# Patient Record
Sex: Male | Born: 1950 | Race: Black or African American | Hispanic: No | Marital: Married | Smoking: Former smoker
Health system: Southern US, Community
[De-identification: ages and names within clinical notes are randomized; demographics above are authoritative.]

## PROBLEM LIST (undated history)

## (undated) DIAGNOSIS — I10 Essential (primary) hypertension: Secondary | ICD-10-CM

## (undated) DIAGNOSIS — E785 Hyperlipidemia, unspecified: Secondary | ICD-10-CM

## (undated) DIAGNOSIS — J45909 Unspecified asthma, uncomplicated: Secondary | ICD-10-CM

## (undated) DIAGNOSIS — K469 Unspecified abdominal hernia without obstruction or gangrene: Secondary | ICD-10-CM

## (undated) DIAGNOSIS — E119 Type 2 diabetes mellitus without complications: Secondary | ICD-10-CM

## (undated) DIAGNOSIS — N400 Enlarged prostate without lower urinary tract symptoms: Secondary | ICD-10-CM

## (undated) HISTORY — DX: Hyperlipidemia, unspecified: E78.5

## (undated) HISTORY — DX: Unspecified abdominal hernia without obstruction or gangrene: K46.9

## (undated) HISTORY — DX: Type 2 diabetes mellitus without complications: E11.9

## (undated) HISTORY — PX: EYE SURGERY: SHX253

## (undated) HISTORY — DX: Essential (primary) hypertension: I10

## (undated) HISTORY — PX: CIRCUMCISION: SHX1350

---

## 2010-12-25 DIAGNOSIS — E785 Hyperlipidemia, unspecified: Secondary | ICD-10-CM | POA: Insufficient documentation

## 2011-11-26 DIAGNOSIS — E119 Type 2 diabetes mellitus without complications: Secondary | ICD-10-CM | POA: Insufficient documentation

## 2012-07-25 DIAGNOSIS — H269 Unspecified cataract: Secondary | ICD-10-CM | POA: Insufficient documentation

## 2012-07-25 DIAGNOSIS — G4730 Sleep apnea, unspecified: Secondary | ICD-10-CM | POA: Insufficient documentation

## 2012-07-25 DIAGNOSIS — I10 Essential (primary) hypertension: Secondary | ICD-10-CM | POA: Insufficient documentation

## 2013-12-31 DIAGNOSIS — Z550 Illiteracy and low-level literacy: Secondary | ICD-10-CM | POA: Insufficient documentation

## 2015-07-01 DIAGNOSIS — R972 Elevated prostate specific antigen [PSA]: Secondary | ICD-10-CM | POA: Insufficient documentation

## 2016-11-08 DIAGNOSIS — N401 Enlarged prostate with lower urinary tract symptoms: Secondary | ICD-10-CM | POA: Insufficient documentation

## 2016-11-08 DIAGNOSIS — R3912 Poor urinary stream: Secondary | ICD-10-CM

## 2017-01-03 DIAGNOSIS — Z720 Tobacco use: Secondary | ICD-10-CM | POA: Insufficient documentation

## 2017-09-30 ENCOUNTER — Encounter: Payer: Self-pay | Admitting: *Deleted

## 2017-10-08 ENCOUNTER — Other Ambulatory Visit: Payer: Self-pay

## 2017-10-08 DIAGNOSIS — Z1211 Encounter for screening for malignant neoplasm of colon: Secondary | ICD-10-CM

## 2017-10-22 ENCOUNTER — Telehealth: Payer: Self-pay | Admitting: Gastroenterology

## 2017-10-22 NOTE — Telephone Encounter (Signed)
Pt wife would like to r/s procedure she states they do not have the  Money to pay up front please call to r/s

## 2017-10-22 NOTE — Telephone Encounter (Signed)
Patients wife contacted office to reschedule colonoscopy due to finances.  Offered to change rx to alternative she said they still would not have finances until check day.  Colonoscopy has been rescheduled to 11/12/17 with Dr. Vicente Males.  Endoscopy has been informed.  Thanks [employer]

## 2017-10-29 ENCOUNTER — Ambulatory Visit: Admit: 2017-10-29 | Payer: Self-pay | Admitting: Gastroenterology

## 2017-10-29 SURGERY — COLONOSCOPY WITH PROPOFOL
Anesthesia: General

## 2017-12-25 ENCOUNTER — Other Ambulatory Visit: Payer: Self-pay

## 2017-12-25 ENCOUNTER — Ambulatory Visit (INDEPENDENT_AMBULATORY_CARE_PROVIDER_SITE_OTHER): Payer: Medicare Other | Admitting: Gastroenterology

## 2017-12-25 ENCOUNTER — Encounter: Payer: Self-pay | Admitting: Gastroenterology

## 2017-12-25 VITALS — BP 126/75 | HR 66 | Resp 16 | Wt 156.4 lb

## 2017-12-25 DIAGNOSIS — K4090 Unilateral inguinal hernia, without obstruction or gangrene, not specified as recurrent: Secondary | ICD-10-CM | POA: Diagnosis not present

## 2017-12-25 DIAGNOSIS — R1031 Right lower quadrant pain: Secondary | ICD-10-CM

## 2017-12-25 DIAGNOSIS — N5089 Other specified disorders of the male genital organs: Secondary | ICD-10-CM

## 2017-12-25 DIAGNOSIS — G8929 Other chronic pain: Secondary | ICD-10-CM | POA: Diagnosis not present

## 2017-12-25 NOTE — Progress Notes (Signed)
Cephas Darby, MD [address]  [place]  [place], [place] [address]  Main: [phone]  Fax: [phone]    Gastroenterology Consultation  Referring Provider:     [facility] [place] Physician:  Linus Salmons, PA-C Primary Gastroenterologist:  Dr. Cephas Darby Reason for Consultation:     Right inguinal swelling and pain        HPI:   Malik Bridges is a 67 y.o. male referred by Dr. Cleaster Corin, Lucky Rathke, PA-C  for consultation & management of right inguinal swelling and pain and to discuss about colonoscopy.  Patient reports that he has noticed swelling in his right inguinal area about 5 years ago, progressively getting worse.  Lately, it is causing discomfort.  He also notices swelling in his scrotal area as well. He was seen by urology in 2018 at [facility] for elevated PSA who recommended prostate biopsy.  From chart review, it appears that patient did not follow-up.  He denies rectal bleeding, constipation or diarrhea.  He denies weight loss.  He reports having had a colonoscopy several years ago at [facility].  He does not recall the results from the procedure.  NSAIDs: None  Antiplts/Anticoagulants/Anti thrombotics: None  GI Procedures: Colonoscopy at [facility] several years ago, report not available He denies any GI surgeries He denies family history of GI malignancy in first-degree relatives  History reviewed. No pertinent past medical history.  History reviewed. No pertinent surgical history.  Current Outpatient Medications:  .  albuterol (PROVENTIL HFA;VENTOLIN HFA) 108 (90 Base) MCG/ACT inhaler, Inhale into the lungs., Disp: , Rfl:  .  aspirin EC 81 MG tablet, Take by mouth., Disp: , Rfl:  .  atorvastatin (LIPITOR) 20 MG tablet, Take by mouth., Disp: , Rfl:  .  glucose blood (PRECISION QID TEST) test strip, Check twice a day, E11.42, Disp: , Rfl:  .  lisinopril (PRINIVIL,ZESTRIL) 20 MG tablet, Take by mouth., Disp: , Rfl:  .  metFORMIN (GLUCOPHAGE)  1000 MG tablet, Take by mouth., Disp: , Rfl:  .  Misc. Devices (DELUXE TABLET CUTTER) MISC, Use to cut metformin tablets., Disp: , Rfl:  .  spironolactone (ALDACTONE) 25 MG tablet, Take by mouth., Disp: , Rfl:  .  tamsulosin (FLOMAX) 0.4 MG CAPS capsule, Take by mouth., Disp: , Rfl:  .  nicotine polacrilex (NICORETTE) 2 MG gum, Take by mouth., Disp: , Rfl:     History reviewed. No pertinent family history.   Social History   Tobacco Use  . Smoking status: Current Every Day Smoker    Types: Cigarettes  . Smokeless tobacco: Never Used  Substance Use Topics  . Alcohol use: Not Currently  . Drug use: Never    Allergies as of 12/25/2017 - Review Complete 12/25/2017  Allergen Reaction Noted  . Hydrochlorothiazide Other (See Comments) 03/16/2014    Review of Systems:    All systems reviewed and negative except where noted in HPI.   Physical Exam:  BP 126/75 (BP Location: Left Arm, Patient Position: Sitting, Cuff Size: Normal)   Pulse 66   Resp 16   Wt 156 lb 6.4 oz (70.9 kg)  No LMP for male patient.  General:   Alert,  Well-developed, well-nourished, pleasant and cooperative in NAD Head:  Normocephalic and atraumatic. Eyes:  Sclera clear, no icterus.   Conjunctiva pink. Ears:  Normal auditory acuity. Nose:  No deformity, discharge, or lesions. Mouth:  No deformity or lesions,oropharynx pink & moist. Neck:  Supple; no masses  or thyromegaly. Lungs:  Respirations even and unlabored.  Clear throughout to auscultation.   No wheezes, crackles, or rhonchi. No acute distress. Heart:  Regular rate and rhythm; no murmurs, clicks, rubs, or gallops. Abdomen:  Normal bowel sounds. Soft, non-tender, obese and non-distended, lemon sized localized swelling in the right inguinal area, mildly tender.  No guarding or rebound tenderness.   Rectal: Not performed Msk:  Symmetrical without gross deformities. Good, equal movement & strength bilaterally. Pulses:  Normal pulses noted. Extremities:   No clubbing or edema.  No cyanosis. Neurologic:  Alert and oriented x3;  grossly normal neurologically. Skin:  Intact without significant lesions or rashes. No jaundice. Lymph Nodes:  No significant cervical adenopathy. Psych:  Alert and cooperative. Normal mood and affect.  Imaging Studies: None  Assessment and Plan:   Malik Bridges is a 67 y.o. African-American male with obesity, hypertension, hyperlipidemia, diabetes who presents with chronic right inguinal swelling and discomfort.  Right inguinal swelling that discomfort Recommend CT abdomen and pelvis with contrast Ultrasound of the scrotum Check CBC, CMP  Colon cancer screening Discuss about colonoscopy after the above work-up.  If CT confirms presence of inguinal hernia, I will refer to surgery first prior to performing colonoscopy.   Follow up in 1 month   Cephas Darby, MD

## 2017-12-26 LAB — COMPREHENSIVE METABOLIC PANEL
ALBUMIN: 4.5 g/dL (ref 3.6–4.8)
ALK PHOS: 75 IU/L (ref 39–117)
ALT: 16 IU/L (ref 0–44)
AST: 23 IU/L (ref 0–40)
Albumin/Globulin Ratio: 1.6 (ref 1.2–2.2)
BUN / CREAT RATIO: 8 — AB (ref 10–24)
BUN: 7 mg/dL — ABNORMAL LOW (ref 8–27)
Bilirubin Total: 0.6 mg/dL (ref 0.0–1.2)
CO2: 26 mmol/L (ref 20–29)
CREATININE: 0.9 mg/dL (ref 0.76–1.27)
Calcium: 9.3 mg/dL (ref 8.6–10.2)
Chloride: 100 mmol/L (ref 96–106)
GFR, EST AFRICAN AMERICAN: 102 mL/min/{1.73_m2} (ref >59)
GFR, EST NON AFRICAN AMERICAN: 88 mL/min/{1.73_m2} (ref >59)
GLOBULIN, TOTAL: 2.9 g/dL (ref 1.5–4.5)
Glucose: 138 mg/dL — ABNORMAL HIGH (ref 65–99)
Potassium: 4.1 mmol/L (ref 3.5–5.2)
SODIUM: 141 mmol/L (ref 134–144)
TOTAL PROTEIN: 7.4 g/dL (ref 6.0–8.5)

## 2017-12-26 LAB — CBC
HEMATOCRIT: 48.4 % (ref 37.5–51.0)
HEMOGLOBIN: 15.7 g/dL (ref 13.0–17.7)
MCH: 27.2 pg (ref 26.6–33.0)
MCHC: 32.4 g/dL (ref 31.5–35.7)
MCV: 84 fL (ref 79–97)
Platelets: 222 10*3/uL (ref 150–450)
RBC: 5.78 x10E6/uL (ref 4.14–5.80)
RDW: 12.3 % (ref 12.3–15.4)
WBC: 3.2 10*3/uL — ABNORMAL LOW (ref 3.4–10.8)

## 2017-12-30 ENCOUNTER — Emergency Department
Admission: EM | Admit: 2017-12-30 | Discharge: 2017-12-30 | Disposition: A | Discharge status: Home / Self Care | Payer: Medicare Other | Attending: Emergency Medicine | Admitting: Emergency Medicine

## 2017-12-30 ENCOUNTER — Other Ambulatory Visit: Payer: Self-pay

## 2017-12-30 DIAGNOSIS — R109 Unspecified abdominal pain: Secondary | ICD-10-CM | POA: Insufficient documentation

## 2017-12-30 DIAGNOSIS — K5900 Constipation, unspecified: Secondary | ICD-10-CM | POA: Insufficient documentation

## 2017-12-30 DIAGNOSIS — Z5321 Procedure and treatment not carried out due to patient leaving prior to being seen by health care provider: Secondary | ICD-10-CM | POA: Diagnosis not present

## 2017-12-30 LAB — CBC
HEMATOCRIT: 45.2 % (ref 39.0–52.0)
HEMOGLOBIN: 14.9 g/dL (ref 13.0–17.0)
MCH: 27.5 pg (ref 26.0–34.0)
MCHC: 33 g/dL (ref 30.0–36.0)
MCV: 83.5 fL (ref 80.0–100.0)
Platelets: 199 10*3/uL (ref 150–400)
RBC: 5.41 MIL/uL (ref 4.22–5.81)
RDW: 12.1 % (ref 11.5–15.5)
WBC: 4.1 10*3/uL (ref 4.0–10.5)
nRBC: 0 % (ref 0.0–0.2)

## 2017-12-30 LAB — COMPREHENSIVE METABOLIC PANEL
ALBUMIN: 4.1 g/dL (ref 3.5–5.0)
ALT: 13 U/L (ref 0–44)
AST: 21 U/L (ref 15–41)
Alkaline Phosphatase: 62 U/L (ref 38–126)
Anion gap: 8 (ref 5–15)
BUN: 9 mg/dL (ref 8–23)
CHLORIDE: 102 mmol/L (ref 98–111)
CO2: 28 mmol/L (ref 22–32)
CREATININE: 0.84 mg/dL (ref 0.61–1.24)
Calcium: 8.7 mg/dL — ABNORMAL LOW (ref 8.9–10.3)
GFR calc Af Amer: >60 mL/min (ref >60)
GFR calc non Af Amer: >60 mL/min (ref >60)
GLUCOSE: 135 mg/dL — AB (ref 70–99)
POTASSIUM: 3.3 mmol/L — AB (ref 3.5–5.1)
Sodium: 138 mmol/L (ref 135–145)
Total Bilirubin: 1.1 mg/dL (ref 0.3–1.2)
Total Protein: 7.3 g/dL (ref 6.5–8.1)

## 2017-12-30 LAB — LIPASE, BLOOD: LIPASE: 44 U/L (ref 11–51)

## 2017-12-30 NOTE — ED Triage Notes (Signed)
Pt c/o constipation and abdominal pain x5 days.

## 2017-12-30 NOTE — ED Notes (Signed)
Malik Bridges registration says patient left at [phone]

## 2018-01-01 ENCOUNTER — Telehealth: Payer: Self-pay | Admitting: Gastroenterology

## 2018-01-01 ENCOUNTER — Ambulatory Visit: Payer: Medicare Other

## 2018-01-01 NOTE — Telephone Encounter (Signed)
Malik Bridges with CT scan is calling she has been trying to reach pt but is unable to leave a message for pt to call her to inform about CT prep cb [phone]

## 2018-01-02 ENCOUNTER — Ambulatory Visit: Admission: RE | Admit: 2018-01-02 | Payer: Medicare Other | Source: Ambulatory Visit

## 2018-01-02 ENCOUNTER — Ambulatory Visit: Payer: Medicare Other

## 2018-01-09 ENCOUNTER — Ambulatory Visit
Admission: RE | Admit: 2018-01-09 | Discharge: 2018-01-09 | Disposition: A | Discharge status: Home / Self Care | Payer: Medicare Other | Source: Ambulatory Visit | Attending: Gastroenterology | Admitting: Gastroenterology

## 2018-01-09 DIAGNOSIS — N400 Enlarged prostate without lower urinary tract symptoms: Secondary | ICD-10-CM | POA: Insufficient documentation

## 2018-01-09 DIAGNOSIS — I700 Atherosclerosis of aorta: Secondary | ICD-10-CM | POA: Insufficient documentation

## 2018-01-09 DIAGNOSIS — N5089 Other specified disorders of the male genital organs: Secondary | ICD-10-CM | POA: Insufficient documentation

## 2018-01-09 DIAGNOSIS — K4020 Bilateral inguinal hernia, without obstruction or gangrene, not specified as recurrent: Secondary | ICD-10-CM | POA: Diagnosis not present

## 2018-01-09 DIAGNOSIS — N503 Cyst of epididymis: Secondary | ICD-10-CM | POA: Insufficient documentation

## 2018-01-09 DIAGNOSIS — N433 Hydrocele, unspecified: Secondary | ICD-10-CM | POA: Insufficient documentation

## 2018-01-09 DIAGNOSIS — R1031 Right lower quadrant pain: Secondary | ICD-10-CM | POA: Diagnosis not present

## 2018-01-09 DIAGNOSIS — K869 Disease of pancreas, unspecified: Secondary | ICD-10-CM | POA: Diagnosis not present

## 2018-01-09 DIAGNOSIS — G8929 Other chronic pain: Secondary | ICD-10-CM | POA: Diagnosis not present

## 2018-01-09 MED ORDER — IOPAMIDOL (ISOVUE-300) INJECTION 61%
100.0000 mL | Freq: Once | INTRAVENOUS | Status: AC | PRN
  Administered 2018-01-09: 100 mL via INTRAVENOUS

## 2018-02-06 ENCOUNTER — Ambulatory Visit: Payer: Medicare Other | Admitting: Gastroenterology

## 2018-03-20 ENCOUNTER — Telehealth: Payer: Self-pay | Admitting: Gastroenterology

## 2018-03-20 ENCOUNTER — Encounter: Payer: Self-pay | Admitting: Gastroenterology

## 2018-03-20 ENCOUNTER — Ambulatory Visit (INDEPENDENT_AMBULATORY_CARE_PROVIDER_SITE_OTHER): Payer: Medicare Other | Admitting: Gastroenterology

## 2018-03-20 VITALS — BP 156/80 | HR 76 | Resp 17 | Wt 160.0 lb

## 2018-03-20 DIAGNOSIS — N433 Hydrocele, unspecified: Secondary | ICD-10-CM | POA: Diagnosis not present

## 2018-03-20 DIAGNOSIS — K4020 Bilateral inguinal hernia, without obstruction or gangrene, not specified as recurrent: Secondary | ICD-10-CM

## 2018-03-20 DIAGNOSIS — K862 Cyst of pancreas: Secondary | ICD-10-CM | POA: Diagnosis not present

## 2018-03-20 DIAGNOSIS — Z1211 Encounter for screening for malignant neoplasm of colon: Secondary | ICD-10-CM | POA: Diagnosis not present

## 2018-03-20 NOTE — Telephone Encounter (Signed)
Malik Bridges's called & would like to know when & where he needs to get his bowel prep for his colonoscopy. Please advise

## 2018-03-20 NOTE — Progress Notes (Signed)
Cephas Darby, MD [address]  [place]  [place], [place] [address]  Main: [phone]  Fax: [phone]    Gastroenterology Consultation  Referring Provider:     Kerri Perches, PA-C Primary Care Physician:  Kerri Perches, PA-C Primary Gastroenterologist:  Dr. Cephas Darby Reason for Consultation:     Right inguinal swelling and pain        HPI:   Malik Bridges is a 68 y.o. male referred by Dr. Kerri Perches, PA-C  for consultation & management of right inguinal swelling and pain and to discuss about colonoscopy.  Patient reports that he has noticed swelling in his right inguinal area about 5 years ago, progressively getting worse.  Lately, it is causing discomfort.  He also notices swelling in his scrotal area as well. He was seen by urology in 2018 at [facility] for elevated PSA who recommended prostate biopsy.  From chart review, it appears that patient did not follow-up.  He denies rectal bleeding, constipation or diarrhea.  He denies weight loss.  He reports having had a colonoscopy several years ago at [facility].  He does not recall the results from the procedure.  Follow-up visit 03/20/2018 Patient underwent a CT abdomen which revealed fat-containing bilateral inguinal hernias and enlarged prostate.  He underwent ultrasound scrotum which revealed bilateral large hydroceles.  Patient is here to discuss about further recommendations.  He reports ongoing symptoms from hydrocele.  He did not undergo colonoscopy with me yet.  CT also revealed 1 cm cystic mass in head of the pancreas adjacent to the distal CBD  NSAIDs: None  Antiplts/Anticoagulants/Anti thrombotics: None  GI Procedures: Colonoscopy at [facility] several years ago, report not available He denies any GI surgeries He denies family history of GI malignancy in first-degree relatives  Past Medical History:  Diagnosis Date  . Abdominal hernia   . Diabetes mellitus ([place])    Pt taking Insulin  . Hyperlipidemia   .  Hypertension     No past surgical history on file.  Current Outpatient Medications:  .  albuterol (PROVENTIL HFA;VENTOLIN HFA) 108 (90 Base) MCG/ACT inhaler, Inhale into the lungs., Disp: , Rfl:  .  aspirin EC 81 MG tablet, Take by mouth., Disp: , Rfl:  .  atorvastatin (LIPITOR) 20 MG tablet, Take by mouth., Disp: , Rfl:  .  glucose blood (PRECISION QID TEST) test strip, Check twice a day, E11.42, Disp: , Rfl:  .  lisinopril (PRINIVIL,ZESTRIL) 20 MG tablet, Take by mouth., Disp: , Rfl:  .  metFORMIN (GLUCOPHAGE) 1000 MG tablet, Take by mouth., Disp: , Rfl:  .  Misc. Devices (DELUXE TABLET CUTTER) MISC, Use to cut metformin tablets., Disp: , Rfl:  .  nicotine polacrilex (NICORETTE) 2 MG gum, Take by mouth., Disp: , Rfl:  .  spironolactone (ALDACTONE) 25 MG tablet, Take by mouth., Disp: , Rfl:  .  tamsulosin (FLOMAX) 0.4 MG CAPS capsule, Take by mouth., Disp: , Rfl:     No family history on file.   Social History   Tobacco Use  . Smoking status: Current Every Day Smoker    Types: Cigarettes  . Smokeless tobacco: Never Used  Substance Use Topics  . Alcohol use: Not Currently  . Drug use: Never    Allergies as of 03/20/2018 - Review Complete 03/20/2018  Allergen Reaction Noted  . Hydrochlorothiazide Other (See Comments) 03/16/2014    Review of Systems:    All systems reviewed and negative except where noted in HPI.  Physical Exam:  BP (!) 156/80 (BP Location: Left Arm, Patient Position: Sitting, Cuff Size: Large)   Pulse 76   Resp 17   Wt 160 lb (72.6 kg)   BMI 29.26 kg/m  No LMP for male patient.  General:   Alert,  Well-developed, well-nourished, pleasant and cooperative in NAD Head:  Normocephalic and atraumatic. Eyes:  Sclera clear, no icterus.   Conjunctiva pink. Ears:  Normal auditory acuity. Nose:  No deformity, discharge, or lesions. Mouth:  No deformity or lesions,oropharynx pink & moist. Neck:  Supple; no masses or thyromegaly. Lungs:  Respirations even  and unlabored.  Clear throughout to auscultation.   No wheezes, crackles, or rhonchi. No acute distress. Heart:  Regular rate and rhythm; no murmurs, clicks, rubs, or gallops. Abdomen:  Normal bowel sounds. Soft, non-tender, obese and non-distended, lemon sized localized swelling in the right inguinal area, mildly tender.  No guarding or rebound tenderness.   Rectal: Not performed Msk:  Symmetrical without gross deformities. Good, equal movement & strength bilaterally. Pulses:  Normal pulses noted. Extremities:  No clubbing or edema.  No cyanosis. Neurologic:  Alert and oriented x3;  grossly normal neurologically. Skin:  Intact without significant lesions or rashes. No jaundice. Psych:  Alert and cooperative. Normal mood and affect.  Imaging Studies: Reviewed  Assessment and Plan:   Malik Bridges is a 68 y.o. African-American male with obesity, hypertension, hyperlipidemia, diabetes who presents for follow-up of bilateral inguinal hernia and bilateral hydroceles  Bilateral inguinal hernia and bilateral hydroceles Referral to general surgery for surgical evaluation  Colon cancer screening Recommend screening colonoscopy in next 2 to 3 weeks  I have discussed alternative options, risks & benefits,  which include, but are not limited to, bleeding, infection, perforation,respiratory complication & drug reaction.  The patient agrees with this plan & written consent will be obtained.    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

## 2018-03-21 NOTE — Telephone Encounter (Signed)
[facility] called again inquiring about patient's colonoscopy & prep. Please advise

## 2018-03-24 ENCOUNTER — Other Ambulatory Visit: Payer: Self-pay

## 2018-03-24 ENCOUNTER — Telehealth: Payer: Self-pay | Admitting: Gastroenterology

## 2018-03-24 DIAGNOSIS — Z1211 Encounter for screening for malignant neoplasm of colon: Secondary | ICD-10-CM

## 2018-03-24 NOTE — Telephone Encounter (Signed)
Colonoscopy has been scheduled for 04/10/2018, pt has been notified

## 2018-03-24 NOTE — Telephone Encounter (Signed)
Patient called & L/M on our V/M stating he was returning a call to our office.

## 2018-04-04 ENCOUNTER — Encounter: Payer: Self-pay | Admitting: *Deleted

## 2018-04-07 ENCOUNTER — Encounter: Admission: RE | Payer: Self-pay | Source: Home / Self Care

## 2018-04-07 ENCOUNTER — Telehealth: Payer: Self-pay | Admitting: Gastroenterology

## 2018-04-07 ENCOUNTER — Ambulatory Visit: Admission: RE | Admit: 2018-04-07 | Payer: Medicare Other | Source: Home / Self Care | Admitting: Gastroenterology

## 2018-04-07 DIAGNOSIS — Z1211 Encounter for screening for malignant neoplasm of colon: Secondary | ICD-10-CM

## 2018-04-07 SURGERY — COLONOSCOPY WITH PROPOFOL
Anesthesia: General

## 2018-04-07 NOTE — Telephone Encounter (Signed)
Nilda Simmer called to let us know Mr Malik Bridges was out of town & would not be at his Endoscopy apptment this morning @ 8:00am. She would like to reschedule,please call.

## 2018-04-08 NOTE — Telephone Encounter (Signed)
Malik Bridges's left vm regarding prev. message

## 2018-04-08 NOTE — Telephone Encounter (Signed)
I have made several attempts to contact pt and pt's VM is full unable to leave message.

## 2018-04-09 ENCOUNTER — Other Ambulatory Visit: Payer: Self-pay

## 2018-04-09 DIAGNOSIS — Z1211 Encounter for screening for malignant neoplasm of colon: Secondary | ICD-10-CM

## 2018-04-14 ENCOUNTER — Other Ambulatory Visit: Payer: Self-pay

## 2018-04-14 ENCOUNTER — Encounter: Payer: Self-pay | Admitting: *Deleted

## 2018-04-14 ENCOUNTER — Ambulatory Visit (INDEPENDENT_AMBULATORY_CARE_PROVIDER_SITE_OTHER): Payer: Medicare Other | Admitting: Surgery

## 2018-04-14 ENCOUNTER — Encounter: Payer: Self-pay | Admitting: Surgery

## 2018-04-14 ENCOUNTER — Telehealth: Payer: Self-pay | Admitting: *Deleted

## 2018-04-14 VITALS — BP 152/79 | HR 70 | Temp 97.7°F | Ht 62.0 in | Wt 162.0 lb

## 2018-04-14 DIAGNOSIS — K4020 Bilateral inguinal hernia, without obstruction or gangrene, not specified as recurrent: Secondary | ICD-10-CM | POA: Diagnosis not present

## 2018-04-14 NOTE — Progress Notes (Signed)
Patient's robotic bilateral inguinal hernia repair and umbilical hernia repair to be scheduled for 05-01-18 at [facility] with Dr. Dahlia Byes.  The patient is aware he will need to Pre-Admit. Patient will check in at the [place] [place], [address] (first floor). Patient has requested we contact his wife or daughter with Pre-admit appointment date and time.   The patient is aware to call the office should he have further questions.

## 2018-04-14 NOTE — H&P (View-Only) (Signed)
Patient ID: Malik Bridges, male   DOB: [birth year], 68 y.o.   MRN: [id number]  HPI Malik Bridges is a 68 y.o. male seen in consultation for symptomatic bilateral inguinal hernias.  Ports that he has inguinal pain for the last several months more pronounced on the right side.  It is intermittent moderate intensity and sharp in nature.  Worsening with Valsalva.  No evidence of strangulation or incarceration.  He has never had any previous abdominal operation or hernia repairs in the past.  He has had multiple images including a CT scan of the abdomen and pelvis that I have personally review showing evidence of bilateral inguinal hernia with large hydroceles bilateral.  I have also reviewed personally his ultrasound having similar findings.  There is no evidence of masses or ischemia within the testicle. We will to perform more than 4 METS of activity without any shortness of the chest pain but he does uses a cane for support.  He does not smoke but he dips tobacco.  No drinking.  No recent heart attacks.  No chest pain no shortness of breath.  Weight loss.  Have a CBC and a CMP 2 months ago that it was within normal limits including a normal sugar  HPI  Past Medical History:  Diagnosis Date  . Abdominal hernia   . Diabetes mellitus ([place])    Pt taking Insulin  . Hyperlipidemia   . Hypertension     History reviewed. No pertinent surgical history.  History reviewed. No pertinent family history.  Social History Social History   Tobacco Use  . Smoking status: Former Smoker    Types: Cigarettes  . Smokeless tobacco: Current User    Types: Snuff  Substance Use Topics  . Alcohol use: Not Currently  . Drug use: Never    Allergies  Allergen Reactions  . Hydrochlorothiazide Other (See Comments)    K to 3.0 on Zestoretic 20-25    Current Outpatient Medications  Medication Sig Dispense Refill  . albuterol (PROVENTIL HFA;VENTOLIN HFA) 108 (90 Base) MCG/ACT inhaler Inhale into the lungs.    Marland Kitchen  aspirin EC 81 MG tablet Take by mouth.    Marland Kitchen atorvastatin (LIPITOR) 20 MG tablet Take by mouth.    Marland Kitchen glucose blood (PRECISION QID TEST) test strip Check twice a day, E11.42    . lisinopril (PRINIVIL,ZESTRIL) 20 MG tablet Take by mouth.    . metFORMIN (GLUCOPHAGE) 1000 MG tablet Take by mouth.    . Misc. Devices (DELUXE TABLET CUTTER) MISC Use to cut metformin tablets.    . nicotine polacrilex (NICORETTE) 2 MG gum Take by mouth.    . spironolactone (ALDACTONE) 25 MG tablet Take by mouth.    . tamsulosin (FLOMAX) 0.4 MG CAPS capsule Take by mouth.     No current facility-administered medications for this visit.      Review of Systems Full ROS  was asked and was negative except for the information on the HPI  Physical Exam Blood pressure (!) 152/79, pulse 70, temperature 97.7 F (36.5 C), temperature source Skin, height 5\' 2"  (1.575 m), weight 162 lb (73.5 kg), SpO2 97 %. CONSTITUTIONAL: NAD EYES: Pupils are equal, round, and reactive to light, Sclera are non-icteric. EARS, NOSE, MOUTH AND THROAT: The oropharynx is clear. The oral mucosa is pink and moist. Hearing is intact to voice. LYMPH NODES:  Lymph nodes in the neck are normal. RESPIRATORY:  Lungs are clear. There is normal respiratory effort, with equal breath sounds bilaterally, and  without pathologic use of accessory muscles. CARDIOVASCULAR: Heart is regular without murmurs, gallops, or rubs. GI: The abdomen is soft, nontender, and nondistended. There are no palpable masses. There is no hepatosplenomegaly. There are normal bowel sounds in all quadrants. There is a reducible 2 cm umbilical hernia and there is bilateral reducible inguinal hernias right larger than the left. GU: No testicular masses however there is bilateral large hydroceles. MUSCULOSKELETAL: Normal muscle strength and tone. No cyanosis or edema.   SKIN: Turgor is good and there are no pathologic skin lesions or ulcers. NEUROLOGIC: Motor and sensation is grossly  normal. Cranial nerves are grossly intact. PSYCH:  Oriented to person, place and time. Affect is normal.  Data Reviewed  I have personally reviewed the patient's imaging, laboratory findings and medical records.    Assessment/Plan  68 year old male with bilateral inguinal hernias associated with lateral hydroceles.  Given that he is symptomatic I definitely recommend repair.  I do think that he is at very good candidate for robotic inguinal hernia repair.  Procedure discussed with the patient in detail.  Risk benefits and possible complications including but not limited to: Bleeding, recurrence, chronic pain, mesh issues were discussed with the patient in detail.  We also discussed perioperative outcomes as well as expectations.  Specifically I discussed with him the likely persistent of the hydroceles given that this was present before.  He understands and wishes to proceed.  All questions were answered.  We will plan for robotic bilateral laparoscopic inguinal hernia repair as well as an umbilical hernia repair during the same procedure  Caroleen Hamman, MD FACS General Surgeon 04/14/2018, 10:16 AM

## 2018-04-14 NOTE — Patient Instructions (Signed)

## 2018-04-14 NOTE — Telephone Encounter (Signed)
Patient's wife was contacted today and surgery date confirmed-05-01-18.  Wife was also notified of Pre-admit Testing appointment scheduled for 04-21-18 at 8 am.   She verbalizes understanding.

## 2018-04-14 NOTE — Progress Notes (Signed)
Patient ID: Malik Bridges, male   DOB: 1950-06-19, 68 y.o.   MRN: [id number]  HPI Malik Bridges is a 68 y.o. male seen in consultation for symptomatic bilateral inguinal hernias.  Ports that he has inguinal pain for the last several months more pronounced on the right side.  It is intermittent moderate intensity and sharp in nature.  Worsening with Valsalva.  No evidence of strangulation or incarceration.  He has never had any previous abdominal operation or hernia repairs in the past.  He has had multiple images including a CT scan of the abdomen and pelvis that I have personally review showing evidence of bilateral inguinal hernia with large hydroceles bilateral.  I have also reviewed personally his ultrasound having similar findings.  There is no evidence of masses or ischemia within the testicle. We will to perform more than 4 METS of activity without any shortness of the chest pain but he does uses a cane for support.  He does not smoke but he dips tobacco.  No drinking.  No recent heart attacks.  No chest pain no shortness of breath.  Weight loss.  Have a CBC and a CMP 2 months ago that it was within normal limits including a normal sugar  HPI  Past Medical History:  Diagnosis Date  . Abdominal hernia   . Diabetes mellitus ([place])    Pt taking Insulin  . Hyperlipidemia   . Hypertension     History reviewed. No pertinent surgical history.  History reviewed. No pertinent family history.  Social History Social History   Tobacco Use  . Smoking status: Former Smoker    Types: Cigarettes  . Smokeless tobacco: Current User    Types: Snuff  Substance Use Topics  . Alcohol use: Not Currently  . Drug use: Never    Allergies  Allergen Reactions  . Hydrochlorothiazide Other (See Comments)    K to 3.0 on Zestoretic 20-25    Current Outpatient Medications  Medication Sig Dispense Refill  . albuterol (PROVENTIL HFA;VENTOLIN HFA) 108 (90 Base) MCG/ACT inhaler Inhale into the lungs.    Marland Kitchen  aspirin EC 81 MG tablet Take by mouth.    Marland Kitchen atorvastatin (LIPITOR) 20 MG tablet Take by mouth.    Marland Kitchen glucose blood (PRECISION QID TEST) test strip Check twice a day, E11.42    . lisinopril (PRINIVIL,ZESTRIL) 20 MG tablet Take by mouth.    . metFORMIN (GLUCOPHAGE) 1000 MG tablet Take by mouth.    . Misc. Devices (DELUXE TABLET CUTTER) MISC Use to cut metformin tablets.    . nicotine polacrilex (NICORETTE) 2 MG gum Take by mouth.    . spironolactone (ALDACTONE) 25 MG tablet Take by mouth.    . tamsulosin (FLOMAX) 0.4 MG CAPS capsule Take by mouth.     No current facility-administered medications for this visit.      Review of Systems Full ROS  was asked and was negative except for the information on the HPI  Physical Exam Blood pressure (!) 152/79, pulse 70, temperature 97.7 F (36.5 C), temperature source Skin, height 5\' 2"  (1.575 m), weight 162 lb (73.5 kg), SpO2 97 %. CONSTITUTIONAL: NAD EYES: Pupils are equal, round, and reactive to light, Sclera are non-icteric. EARS, NOSE, MOUTH AND THROAT: The oropharynx is clear. The oral mucosa is pink and moist. Hearing is intact to voice. LYMPH NODES:  Lymph nodes in the neck are normal. RESPIRATORY:  Lungs are clear. There is normal respiratory effort, with equal breath sounds bilaterally, and  without pathologic use of accessory muscles. CARDIOVASCULAR: Heart is regular without murmurs, gallops, or rubs. GI: The abdomen is soft, nontender, and nondistended. There are no palpable masses. There is no hepatosplenomegaly. There are normal bowel sounds in all quadrants. There is a reducible 2 cm umbilical hernia and there is bilateral reducible inguinal hernias right larger than the left. GU: No testicular masses however there is bilateral large hydroceles. MUSCULOSKELETAL: Normal muscle strength and tone. No cyanosis or edema.   SKIN: Turgor is good and there are no pathologic skin lesions or ulcers. NEUROLOGIC: Motor and sensation is grossly  normal. Cranial nerves are grossly intact. PSYCH:  Oriented to person, place and time. Affect is normal.  Data Reviewed  I have personally reviewed the patient's imaging, laboratory findings and medical records.    Assessment/Plan  68 year old male with bilateral inguinal hernias associated with lateral hydroceles.  Given that he is symptomatic I definitely recommend repair.  I do think that he is at very good candidate for robotic inguinal hernia repair.  Procedure discussed with the patient in detail.  Risk benefits and possible complications including but not limited to: Bleeding, recurrence, chronic pain, mesh issues were discussed with the patient in detail.  We also discussed perioperative outcomes as well as expectations.  Specifically I discussed with him the likely persistent of the hydroceles given that this was present before.  He understands and wishes to proceed.  All questions were answered.  We will plan for robotic bilateral laparoscopic inguinal hernia repair as well as an umbilical hernia repair during the same procedure  Caroleen Hamman, MD FACS General Surgeon 04/14/2018, 10:16 AM

## 2018-04-21 ENCOUNTER — Other Ambulatory Visit: Payer: Self-pay

## 2018-04-21 ENCOUNTER — Encounter
Admission: RE | Admit: 2018-04-21 | Discharge: 2018-04-21 | Disposition: A | Discharge status: Home / Self Care | Payer: Medicare Other | Source: Ambulatory Visit | Attending: Surgery | Admitting: Surgery

## 2018-04-21 ENCOUNTER — Telehealth: Payer: Self-pay | Admitting: *Deleted

## 2018-04-21 DIAGNOSIS — E118 Type 2 diabetes mellitus with unspecified complications: Secondary | ICD-10-CM | POA: Diagnosis not present

## 2018-04-21 DIAGNOSIS — E785 Hyperlipidemia, unspecified: Secondary | ICD-10-CM | POA: Insufficient documentation

## 2018-04-21 DIAGNOSIS — Z01818 Encounter for other preprocedural examination: Secondary | ICD-10-CM | POA: Diagnosis not present

## 2018-04-21 DIAGNOSIS — Z01810 Encounter for preprocedural cardiovascular examination: Secondary | ICD-10-CM

## 2018-04-21 DIAGNOSIS — I10 Essential (primary) hypertension: Secondary | ICD-10-CM | POA: Diagnosis not present

## 2018-04-21 HISTORY — DX: Benign prostatic hyperplasia without lower urinary tract symptoms: N40.0

## 2018-04-21 LAB — CBC
HCT: 46 % (ref 39.0–52.0)
Hemoglobin: 15.1 g/dL (ref 13.0–17.0)
MCH: 27.4 pg (ref 26.0–34.0)
MCHC: 32.8 g/dL (ref 30.0–36.0)
MCV: 83.3 fL (ref 80.0–100.0)
NRBC: 0 % (ref 0.0–0.2)
PLATELETS: 197 10*3/uL (ref 150–400)
RBC: 5.52 MIL/uL (ref 4.22–5.81)
RDW: 12.5 % (ref 11.5–15.5)
WBC: 4.2 10*3/uL (ref 4.0–10.5)

## 2018-04-21 LAB — BASIC METABOLIC PANEL
Anion gap: 6 (ref 5–15)
BUN: 9 mg/dL (ref 8–23)
CO2: 29 mmol/L (ref 22–32)
Calcium: 8.3 mg/dL — ABNORMAL LOW (ref 8.9–10.3)
Chloride: 101 mmol/L (ref 98–111)
Creatinine, Ser: 0.85 mg/dL (ref 0.61–1.24)
GFR calc Af Amer: >60 mL/min (ref >60)
GFR calc non Af Amer: >60 mL/min (ref >60)
Glucose, Bld: 286 mg/dL — ABNORMAL HIGH (ref 70–99)
Potassium: 3.3 mmol/L — ABNORMAL LOW (ref 3.5–5.1)
Sodium: 136 mmol/L (ref 135–145)

## 2018-04-21 NOTE — Pre-Procedure Instructions (Signed)
AS INDICATED IN DR PABON'S H/P, CAN PERFORM 4 METS. ALSO SPOKE WITH WIFE WHO INDICATED HE CAN WALK 2 BLOCKS OR UP FLIGHT OF STAIRS WITHOUT PROBLEM. OK TO PROCEED BY DR Ola Spurr WHO LOOKED AT EKG

## 2018-04-21 NOTE — Patient Instructions (Addendum)
Your procedure is scheduled on: Thursday 05/01/2018 Report to [facility]. To find out your arrival time please call [phone] between 1PM - 3PM on Wednesday 04/30/2018 .  Remember: Instructions that are not followed completely may result in serious medical risk, up to and including death, or upon the discretion of your surgeon and anesthesiologist your surgery may need to be rescheduled.     _X__ 1. Do not eat food after midnight the night before your procedure.                 No gum chewing or hard candies. You may drink clear liquids up to 2 hours                 before you are scheduled to arrive for your surgery- DO not drink clear                 liquids within 2 hours of the start of your surgery.                 Clear Liquids include:  water, apple juice without pulp, clear carbohydrate                 drink such as Clearfast or Gatorade, Black Coffee or Tea (Do not add                 anything to coffee or tea).  __X__2.  On the morning of surgery brush your teeth with toothpaste and water, you                 may rinse your mouth with mouthwash if you wish.  Do not swallow any              toothpaste of mouthwash.     _X__ 3.  No Alcohol for 24 hours before or after surgery.   _X__ 4.  Do Not Smoke or use e-cigarettes For 24 Hours Prior to Your Surgery.                 Do not use any chewable tobacco products for at least 6 hours prior to                 surgery.  ____  5.  Bring all medications with you on the day of surgery if instructed.   __X__  6.  Notify your doctor if there is any change in your medical condition      (cold, fever, infections).     Do not wear jewelry, make-up, hairpins, clips or nail polish. Do not wear lotions, powders, or perfumes.  Do not shave 48 hours prior to surgery. Men may shave face and neck. Do not bring valuables to the hospital.    [facility] is not responsible for any belongings  or valuables.  Contacts, dentures/partials or body piercings may not be worn into surgery. Bring a case for your contacts, glasses or hearing aids, a denture cup will be supplied. Leave your suitcase in the car. After surgery it may be brought to your room. For patients admitted to the hospital, discharge time is determined by your treatment team.   Patients discharged the day of surgery will not be allowed to drive home.   Please read over the following fact sheets that you were given:   MRSA Information  __X__ Take these medicines the morning of surgery with A SIP OF WATER:  1. tamsulosin (FLOMAX) 0.4 MG CAPS capsule  2. atorvastatin (LIPITOR)  3.   4.  5.  6.  ____ Fleet Enema (as directed)   __X__ Use CHG Soap/SAGE wipes as directed  __X__ Use inhalers on the day of surgery  __X__ Stop metformin 2 days prior to surgery (LAST DOSE Tuesday 04/28/2018)    __X__ Take 1/2 of usual insulin dose the night before surgery. No insulin the morning          of surgery.   ____ Stop Blood Thinners Coumadin/Plavix/Xarelto/Pleta/Pradaxa/Eliquis/Effient/Aspirin  on   Or contact your Surgeon, Cardiologist or Medical Doctor regarding  ability to stop your blood thinners  __X__ Stop Anti-inflammatories 7 days before surgery such as Advil, Ibuprofen, Motrin,  BC or Goodies Powder, Naprosyn, Naproxen, Aleve, Aspirin   STOP ASPRIN Thursday 04/24/2018  __X__ Stop all herbal supplements, fish oil or vitamin E until after surgery.    ____ Bring C-Pap to the hospital.

## 2018-04-21 NOTE — Telephone Encounter (Signed)
I called and spoke with the patient's wife, Mardene Celeste, regarding low potassium on labs drawn today.   Dr. Dahlia Byes had requested that we call in a prescription for potassium chloride 40 meq one PO BID #[id number] no refills.   This has been called in to [facility] per wife's request. She is aware they need to go ahead and pick up this prescription and begin taking and that labs will be re-drawn the morning of surgery. She verbalizes understanding.

## 2018-04-22 ENCOUNTER — Telehealth: Payer: Self-pay | Admitting: *Deleted

## 2018-04-22 NOTE — Telephone Encounter (Signed)
Notified patient as instructed, patient pleased. Discussed follow-up appointments, patient agrees  

## 2018-04-22 NOTE — Telephone Encounter (Signed)
-----   Message from Jules Husbands, MD sent at 04/22/2018  2:03 PM EST ----- Please let the pt know that her BMP ( labs) are ok, nothing abnormal ----- Message ----- From: Interface, Lab In [place] Sent: 04/21/2018  10:21 AM EST To: Jules Husbands, MD

## 2018-04-30 MED ORDER — CEFAZOLIN SODIUM-DEXTROSE 2-4 GM/100ML-% IV SOLN
2.0000 g | INTRAVENOUS | Status: AC
  Administered 2018-05-01: 2 g via INTRAVENOUS

## 2018-05-01 ENCOUNTER — Encounter: Payer: Self-pay | Admitting: *Deleted

## 2018-05-01 ENCOUNTER — Ambulatory Visit
Admission: RE | Admit: 2018-05-01 | Discharge: 2018-05-01 | Disposition: A | Discharge status: Home / Self Care | Payer: Medicare Other | Source: Ambulatory Visit | Attending: Surgery | Admitting: Surgery

## 2018-05-01 ENCOUNTER — Encounter
Admission: RE | Disposition: A | Discharge status: Home / Self Care | Payer: Self-pay | Source: Ambulatory Visit | Attending: Surgery

## 2018-05-01 ENCOUNTER — Ambulatory Visit: Payer: Medicare Other | Admitting: Anesthesiology

## 2018-05-01 ENCOUNTER — Other Ambulatory Visit: Payer: Self-pay

## 2018-05-01 DIAGNOSIS — N433 Hydrocele, unspecified: Secondary | ICD-10-CM | POA: Insufficient documentation

## 2018-05-01 DIAGNOSIS — E785 Hyperlipidemia, unspecified: Secondary | ICD-10-CM | POA: Diagnosis not present

## 2018-05-01 DIAGNOSIS — K429 Umbilical hernia without obstruction or gangrene: Secondary | ICD-10-CM | POA: Diagnosis not present

## 2018-05-01 DIAGNOSIS — Z79899 Other long term (current) drug therapy: Secondary | ICD-10-CM | POA: Insufficient documentation

## 2018-05-01 DIAGNOSIS — Z7951 Long term (current) use of inhaled steroids: Secondary | ICD-10-CM | POA: Insufficient documentation

## 2018-05-01 DIAGNOSIS — K4020 Bilateral inguinal hernia, without obstruction or gangrene, not specified as recurrent: Secondary | ICD-10-CM | POA: Insufficient documentation

## 2018-05-01 DIAGNOSIS — Z794 Long term (current) use of insulin: Secondary | ICD-10-CM | POA: Diagnosis not present

## 2018-05-01 DIAGNOSIS — J45909 Unspecified asthma, uncomplicated: Secondary | ICD-10-CM | POA: Diagnosis not present

## 2018-05-01 DIAGNOSIS — Z7982 Long term (current) use of aspirin: Secondary | ICD-10-CM | POA: Insufficient documentation

## 2018-05-01 DIAGNOSIS — N400 Enlarged prostate without lower urinary tract symptoms: Secondary | ICD-10-CM | POA: Insufficient documentation

## 2018-05-01 DIAGNOSIS — E119 Type 2 diabetes mellitus without complications: Secondary | ICD-10-CM | POA: Insufficient documentation

## 2018-05-01 DIAGNOSIS — Z888 Allergy status to other drugs, medicaments and biological substances status: Secondary | ICD-10-CM | POA: Diagnosis not present

## 2018-05-01 DIAGNOSIS — I10 Essential (primary) hypertension: Secondary | ICD-10-CM | POA: Insufficient documentation

## 2018-05-01 DIAGNOSIS — Z87891 Personal history of nicotine dependence: Secondary | ICD-10-CM | POA: Diagnosis not present

## 2018-05-01 HISTORY — PX: ROBOT ASSISTED INGUINAL HERNIA REPAIR: SHX6561

## 2018-05-01 HISTORY — PX: UMBILICAL HERNIA REPAIR: SHX196

## 2018-05-01 LAB — GLUCOSE, CAPILLARY
Glucose-Capillary: 237 mg/dL — ABNORMAL HIGH (ref 70–99)
Glucose-Capillary: 246 mg/dL — ABNORMAL HIGH (ref 70–99)

## 2018-05-01 LAB — POCT I-STAT 4, (NA,K, GLUC, HGB,HCT)
Glucose, Bld: 226 mg/dL — ABNORMAL HIGH (ref 70–99)
HCT: 51 % (ref 39.0–52.0)
Hemoglobin: 17.3 g/dL — ABNORMAL HIGH (ref 13.0–17.0)
Potassium: 3.7 mmol/L (ref 3.5–5.1)
Sodium: 135 mmol/L (ref 135–145)

## 2018-05-01 SURGERY — ROBOT ASSISTED INGUINAL HERNIA REPAIR
Anesthesia: General | Laterality: Bilateral

## 2018-05-01 MED ORDER — DEXAMETHASONE SODIUM PHOSPHATE 4 MG/ML IJ SOLN
INTRAMUSCULAR | Status: AC
  Filled 2018-05-01: qty 1

## 2018-05-01 MED ORDER — SODIUM CHLORIDE 0.9 % IV SOLN
INTRAVENOUS | Status: DC
  Administered 2018-05-01: 07:00:00 via INTRAVENOUS

## 2018-05-01 MED ORDER — ONDANSETRON HCL 4 MG/2ML IJ SOLN
INTRAMUSCULAR | Status: AC
  Filled 2018-05-01: qty 2

## 2018-05-01 MED ORDER — HYDROCODONE-ACETAMINOPHEN 5-325 MG PO TABS
ORAL_TABLET | ORAL | Status: AC
  Filled 2018-05-01: qty 1

## 2018-05-01 MED ORDER — BUPIVACAINE-EPINEPHRINE 0.25% -1:200000 IJ SOLN
INTRAMUSCULAR | Status: DC | PRN
  Administered 2018-05-01: 30 mL

## 2018-05-01 MED ORDER — ROCURONIUM BROMIDE 50 MG/5ML IV SOLN
INTRAVENOUS | Status: AC
  Filled 2018-05-01: qty 1

## 2018-05-01 MED ORDER — ACETAMINOPHEN 500 MG PO TABS
ORAL_TABLET | ORAL | Status: AC
  Administered 2018-05-01: 1000 mg via ORAL
  Filled 2018-05-01: qty 2

## 2018-05-01 MED ORDER — HYDROMORPHONE HCL 1 MG/ML IJ SOLN
0.2500 mg | INTRAMUSCULAR | Status: DC | PRN
  Administered 2018-05-01 (×3): 0.25 mg via INTRAVENOUS

## 2018-05-01 MED ORDER — LACTATED RINGERS IV SOLN
INTRAVENOUS | Status: DC | PRN
  Administered 2018-05-01 (×2): via INTRAVENOUS

## 2018-05-01 MED ORDER — HYDROCODONE-ACETAMINOPHEN 5-325 MG PO TABS
1.0000 | ORAL_TABLET | Freq: Four times a day (QID) | ORAL | Status: DC | PRN
  Administered 2018-05-01: 1 via ORAL

## 2018-05-01 MED ORDER — SUGAMMADEX SODIUM 200 MG/2ML IV SOLN
INTRAVENOUS | Status: AC
  Filled 2018-05-01: qty 2

## 2018-05-01 MED ORDER — PROPOFOL 10 MG/ML IV BOLUS
INTRAVENOUS | Status: AC
  Filled 2018-05-01: qty 20

## 2018-05-01 MED ORDER — HYDROCODONE-ACETAMINOPHEN 5-325 MG PO TABS: 1.0000 | ORAL_TABLET | Freq: Four times a day (QID) | ORAL | 0 refills | Status: DC | PRN

## 2018-05-01 MED ORDER — GLYCOPYRROLATE 0.2 MG/ML IJ SOLN
INTRAMUSCULAR | Status: DC | PRN
  Administered 2018-05-01: 0.2 mg via INTRAVENOUS

## 2018-05-01 MED ORDER — FAMOTIDINE 20 MG PO TABS
20.0000 mg | ORAL_TABLET | Freq: Once | ORAL | Status: AC
  Administered 2018-05-01: 20 mg via ORAL

## 2018-05-01 MED ORDER — CHLORHEXIDINE GLUCONATE CLOTH 2 % EX PADS: 6.0000 | MEDICATED_PAD | Freq: Once | CUTANEOUS | Status: DC

## 2018-05-01 MED ORDER — EPHEDRINE SULFATE 50 MG/ML IJ SOLN
INTRAMUSCULAR | Status: DC | PRN
  Administered 2018-05-01 (×2): 5 mg via INTRAVENOUS

## 2018-05-01 MED ORDER — GABAPENTIN 300 MG PO CAPS
300.0000 mg | ORAL_CAPSULE | ORAL | Status: AC
  Administered 2018-05-01: 300 mg via ORAL

## 2018-05-01 MED ORDER — MIDAZOLAM HCL 2 MG/2ML IJ SOLN
INTRAMUSCULAR | Status: AC
  Filled 2018-05-01: qty 2

## 2018-05-01 MED ORDER — GABAPENTIN 300 MG PO CAPS
ORAL_CAPSULE | ORAL | Status: AC
  Administered 2018-05-01: 300 mg via ORAL
  Filled 2018-05-01: qty 1

## 2018-05-01 MED ORDER — LIDOCAINE HCL (CARDIAC) PF 100 MG/5ML IV SOSY
PREFILLED_SYRINGE | INTRAVENOUS | Status: DC | PRN
  Administered 2018-05-01: 100 mg via INTRAVENOUS

## 2018-05-01 MED ORDER — PHENYLEPHRINE HCL 10 MG/ML IJ SOLN
INTRAMUSCULAR | Status: DC | PRN
  Administered 2018-05-01: 100 ug via INTRAVENOUS
  Administered 2018-05-01: 50 ug via INTRAVENOUS
  Administered 2018-05-01: 150 ug via INTRAVENOUS

## 2018-05-01 MED ORDER — PROPOFOL 10 MG/ML IV BOLUS
INTRAVENOUS | Status: DC | PRN
  Administered 2018-05-01: 150 mg via INTRAVENOUS

## 2018-05-01 MED ORDER — FENTANYL CITRATE (PF) 100 MCG/2ML IJ SOLN
INTRAMUSCULAR | Status: DC | PRN
  Administered 2018-05-01 (×3): 50 ug via INTRAVENOUS

## 2018-05-01 MED ORDER — ROCURONIUM BROMIDE 100 MG/10ML IV SOLN
INTRAVENOUS | Status: DC | PRN
  Administered 2018-05-01: 5 mg via INTRAVENOUS
  Administered 2018-05-01: 35 mg via INTRAVENOUS
  Administered 2018-05-01: 10 mg via INTRAVENOUS

## 2018-05-01 MED ORDER — FENTANYL CITRATE (PF) 250 MCG/5ML IJ SOLN
INTRAMUSCULAR | Status: AC
  Filled 2018-05-01: qty 5

## 2018-05-01 MED ORDER — SUCCINYLCHOLINE CHLORIDE 20 MG/ML IJ SOLN
INTRAMUSCULAR | Status: AC
  Filled 2018-05-01: qty 1

## 2018-05-01 MED ORDER — CHLORHEXIDINE GLUCONATE CLOTH 2 % EX PADS
6.0000 | MEDICATED_PAD | Freq: Once | CUTANEOUS | Status: AC
  Administered 2018-05-01: 6 via TOPICAL

## 2018-05-01 MED ORDER — ACETAMINOPHEN 500 MG PO TABS
1000.0000 mg | ORAL_TABLET | ORAL | Status: AC
  Administered 2018-05-01: 1000 mg via ORAL

## 2018-05-01 MED ORDER — SUCCINYLCHOLINE CHLORIDE 20 MG/ML IJ SOLN
INTRAMUSCULAR | Status: DC | PRN
  Administered 2018-05-01: 100 mg via INTRAVENOUS

## 2018-05-01 MED ORDER — SUGAMMADEX SODIUM 200 MG/2ML IV SOLN
INTRAVENOUS | Status: DC | PRN
  Administered 2018-05-01: 200 mg via INTRAVENOUS

## 2018-05-01 MED ORDER — BUPIVACAINE-EPINEPHRINE (PF) 0.25% -1:200000 IJ SOLN
INTRAMUSCULAR | Status: AC
  Filled 2018-05-01: qty 30

## 2018-05-01 MED ORDER — LIDOCAINE HCL (PF) 2 % IJ SOLN
INTRAMUSCULAR | Status: AC
  Filled 2018-05-01: qty 10

## 2018-05-01 MED ORDER — MIDAZOLAM HCL 2 MG/2ML IJ SOLN
INTRAMUSCULAR | Status: DC | PRN
  Administered 2018-05-01: 2 mg via INTRAVENOUS

## 2018-05-01 MED ORDER — HYDROMORPHONE HCL 1 MG/ML IJ SOLN
INTRAMUSCULAR | Status: AC
  Administered 2018-05-01: 0.25 mg via INTRAVENOUS
  Filled 2018-05-01: qty 1

## 2018-05-01 MED ORDER — ONDANSETRON HCL 4 MG/2ML IJ SOLN
INTRAMUSCULAR | Status: DC | PRN
  Administered 2018-05-01: 4 mg via INTRAVENOUS

## 2018-05-01 MED ORDER — FAMOTIDINE 20 MG PO TABS
ORAL_TABLET | ORAL | Status: AC
  Administered 2018-05-01: 20 mg via ORAL
  Filled 2018-05-01: qty 1

## 2018-05-01 MED ORDER — DEXAMETHASONE SODIUM PHOSPHATE 10 MG/ML IJ SOLN
INTRAMUSCULAR | Status: DC | PRN
  Administered 2018-05-01: 4 mg via INTRAVENOUS

## 2018-05-01 MED ORDER — CEFAZOLIN SODIUM-DEXTROSE 2-4 GM/100ML-% IV SOLN
INTRAVENOUS | Status: AC
  Filled 2018-05-01: qty 100

## 2018-05-01 SURGICAL SUPPLY — 41 items
CANISTER SUCT 1200ML W/VALVE (MISCELLANEOUS) ×4 IMPLANT
CHLORAPREP W/TINT 26ML (MISCELLANEOUS) ×4 IMPLANT
CORD BIP STRL DISP 12FT (MISCELLANEOUS) ×4 IMPLANT
COVER TIP SHEARS 8 DVNC (MISCELLANEOUS) ×2 IMPLANT
COVER TIP SHEARS 8MM DA VINCI (MISCELLANEOUS) ×2
COVER WAND RF STERILE (DRAPES) ×4 IMPLANT
DEFOGGER SCOPE WARMER CLEARIFY (MISCELLANEOUS) ×4 IMPLANT
DERMABOND ADVANCED (GAUZE/BANDAGES/DRESSINGS) ×2
DERMABOND ADVANCED .7 DNX12 (GAUZE/BANDAGES/DRESSINGS) ×2 IMPLANT
DRAPE 3 ARM ACCESS DA VINCI (DRAPES) ×2
DRAPE 3 ARM ACCESS DVNC (DRAPES) ×2 IMPLANT
DRAPE SHEET LG 3/4 BI-LAMINATE (DRAPES) ×8 IMPLANT
ELECT CAUTERY BLADE 6.4 (BLADE) ×4 IMPLANT
ELECT REM PT RETURN 9FT ADLT (ELECTROSURGICAL) ×4
ELECTRODE REM PT RTRN 9FT ADLT (ELECTROSURGICAL) ×2 IMPLANT
GLOVE BIO SURGEON STRL SZ7 (GLOVE) ×16 IMPLANT
GOWN STRL REUS W/ TWL LRG LVL3 (GOWN DISPOSABLE) ×8 IMPLANT
GOWN STRL REUS W/TWL LRG LVL3 (GOWN DISPOSABLE) ×8
IV NS 1000ML (IV SOLUTION) ×2
IV NS 1000ML BAXH (IV SOLUTION) ×2 IMPLANT
KIT PINK PAD W/HEAD ARE REST (MISCELLANEOUS) ×4
KIT PINK PAD W/HEAD ARM REST (MISCELLANEOUS) ×2 IMPLANT
LABEL OR SOLS (LABEL) ×4 IMPLANT
MESH 3DMAX 4X6 LT LRG (Mesh General) ×4 IMPLANT
MESH 3DMAX 4X6 RT LRG (Mesh General) ×4 IMPLANT
NEEDLE HYPO 22GX1.5 SAFETY (NEEDLE) ×4 IMPLANT
PACK LAP CHOLECYSTECTOMY (MISCELLANEOUS) ×4 IMPLANT
PENCIL ELECTRO HAND CTR (MISCELLANEOUS) ×4 IMPLANT
PROGRASP ENDOWRIST DA VINCI (INSTRUMENTS) ×2
PROGRASP ENDOWRIST DVNC (INSTRUMENTS) ×2 IMPLANT
SOLUTION ELECTROLUBE (MISCELLANEOUS) ×4 IMPLANT
SPONGE LAP 18X18 RF (DISPOSABLE) ×4 IMPLANT
STRAP SAFETY 5IN WIDE (MISCELLANEOUS) ×4 IMPLANT
SUT ETHIBOND NAB MO 7 #0 18IN (SUTURE) ×4 IMPLANT
SUT MNCRL AB 4-0 PS2 18 (SUTURE) ×4 IMPLANT
SUT VIC AB 2-0 SH 27 (SUTURE) ×4
SUT VIC AB 2-0 SH 27XBRD (SUTURE) ×4 IMPLANT
SUT VICRYL 0 AB UR-6 (SUTURE) ×8 IMPLANT
SUT VLOC 90 S/L VL9 GS22 (SUTURE) ×8 IMPLANT
TROCAR BALLN GELPORT 12X130M (ENDOMECHANICALS) ×4 IMPLANT
TUBING EVAC SMOKE HEATED PNEUM (TUBING) ×4 IMPLANT

## 2018-05-01 NOTE — Op Note (Signed)
Robotic assisted Laparoscopic Transabdominal Bilateral Inguinal Hernia Repair with 3 D large Mesh Umbilical hernia repair   Malik Bridges   02/27/2018   Pre-operative Diagnosis:  Bilateral Inguinal Hernia, UH   Post-operative Diagnosis: Same   Procedure: Robotic  Laparoscopic  repair of bilateral inguinal hernias and Umbilical hernia repair   Surgeon: Caroleen Hamman, MD FACS   Anesthesia: Gen. with endotracheal tube   Findings:  Left direct inguinal hernia   and large [place]al[place] hernia on the Right 1cm reducible UH   Procedure Details  The patient was seen again in the Holding Room. The benefits, complications, treatment options, and expected outcomes were discussed with the patient. The risks of bleeding, infection, recurrence of symptoms, failure to resolve symptoms, recurrence of hernia, ischemic orchitis, chronic pain syndrome or neuroma, were discussed again. The likelihood of improving the patient's symptoms with return to their baseline status is good.  The patient and/or family concurred with the proposed plan, giving informed consent.  The patient was taken to Operating Room, identified as Malik Bridges and the procedure verified as Laparoscopic Inguinal Hernia Repair.   A Time Out was held and the above information confirmed.   Prior to the induction of general anesthesia, antibiotic prophylaxis was administered. VTE prophylaxis was in place. General endotracheal anesthesia was then administered and tolerated well. After the induction, the abdomen was prepped with Chloraprep and draped in the sterile fashion. The patient was positioned in the supine position.     umbilical incision created, I tailored the incision because he did have an umbilical hernia.  Hernia sac was identified and excised.  The fascial edges were cleaned and 2 stay sutures were placed on each side the abdominal cavity was entered under direct visualization and hasson trochar placed. Pneumoperitoneum obtained  w/o HD changes. No evidence of bowel injuries.  Two 8 mm placed under direct vision. The laparoscopy revealed large indirect defects. I inserted the needles and the mesh. The robot was brought ot the table and docked in the standard fashion, no collision between arms was observed. Instruments were kept under direct view at all times. We started on the right side were a flap was created. The sac was reduced and dissected free from adjacent structures. We preserved the vas and the vessels. Once dissection was completed a large 3D mesh was placed and secured with two interrupted vicryl attached to the pubic tubercle. There was good coverage of the direct, indirect and femoral spaces.  Please note that on the right side there was a large both direct and indirect hernia The flap was closed with v lock suture.   Attention then was turned to the left side were a flap was created. The sac was reduced and dissected free from adjacent structures. We preserved the vas and the vessels. Once dissection was completed a large 3D mesh was placed and secured with two interrupted vicryl attached to the pubic tubercle. There was good coverage of the direct, indirect and femoral spaces. The flap was closed with v lock suture. Second look revealed no complications or injuries.    Once assuring that hemostasis was adequate the ports were removed and a umbilical hernia was repaired using interrupted Ethibond 0 sutures. 4-0 subcuticular Monocryl was used at all skin edges. Dermabond was placed.  Patient tolerated the procedure well. There were no complications. He was taken to the recovery room in stable condition.                 [place],  MD, FACS

## 2018-05-01 NOTE — Anesthesia Procedure Notes (Addendum)
Procedure Name: Intubation Date/Time: 05/01/2018 7:45 AM Performed by: Durenda Hurt, MD Pre-anesthesia Checklist: Patient identified, Emergency Drugs available, Suction available, Patient being monitored and Timeout performed Patient Re-evaluated:Patient Re-evaluated prior to induction Oxygen Delivery Method: Circle system utilized Preoxygenation: Pre-oxygenation with 100% oxygen Induction Type: IV induction Ventilation: Two handed mask ventilation required Laryngoscope Size: Mac and 4 Grade View: Grade I Tube type: Oral Nasal Tubes: Right Tube size: 7.5 mm Number of attempts: 1 Placement Confirmation: ETT inserted through vocal cords under direct vision,  positive ETCO2 and breath sounds checked- equal and bilateral Secured at: 23 cm Dental Injury: Teeth and Oropharynx as per pre-operative assessment  Difficulty Due To: Difficult Airway- due to dentition Comments: Pt had several loose teeth making intubation difficult and mask ventilation without an oral airway difficult.  After intubation teeth remained as they were prior to instrumentation.

## 2018-05-01 NOTE — Anesthesia Postprocedure Evaluation (Signed)
Anesthesia Post Note  Patient: Malik Bridges  Procedure(s) Performed: ROBOT ASSISTED INGUINAL HERNIA REPAIR (Bilateral ) HERNIA REPAIR UMBILICAL ADULT  Patient location during evaluation: PACU Anesthesia Type: General Level of consciousness: awake and alert Pain management: pain level controlled Vital Signs Assessment: post-procedure vital signs reviewed and stable Respiratory status: spontaneous breathing, nonlabored ventilation, respiratory function stable and patient connected to nasal cannula oxygen Cardiovascular status: blood pressure returned to baseline and stable Postop Assessment: no apparent nausea or vomiting Anesthetic complications: no     Last Vitals:  Vitals:   05/01/18 1117 05/01/18 1220  BP: 117/69 104/75  Pulse: 89 91  Resp: 16 16  Temp: (!) 36.3 C   SpO2: 94% 93%    Last Pain:  Vitals:   05/01/18 1220  TempSrc:   PainSc: [place]

## 2018-05-01 NOTE — Transfer of Care (Signed)
Immediate Anesthesia Transfer of Care Note  Patient: Malik Bridges  Procedure(s) Performed: ROBOT ASSISTED INGUINAL HERNIA REPAIR (Bilateral ) HERNIA REPAIR UMBILICAL ADULT  Patient Location: PACU  Anesthesia Type:General  Level of Consciousness: awake  Airway & Oxygen Therapy: Patient Spontanous Breathing and Patient connected to face mask oxygen  Post-op Assessment: Report given to RN and Post -op Vital signs reviewed and stable  Post vital signs: Reviewed and stable  Last Vitals:  Vitals Value Taken Time  BP 148/71 05/01/2018 10:07 AM  Temp 36.4 C 05/01/2018 10:07 AM  Pulse 83 05/01/2018 10:10 AM  Resp 16 05/01/2018 10:10 AM  SpO2 100 % 05/01/2018 10:10 AM  Vitals shown include unvalidated device data.  Last Pain:  Vitals:   05/01/18 0613  TempSrc: Tympanic  PainSc: 0-No pain         Complications: No apparent anesthesia complications

## 2018-05-01 NOTE — Interval H&P Note (Signed)
History and Physical Interval Note:  05/01/2018 7:19 AM  Malik Bridges  has presented today for surgery, with the diagnosis of Bilateral Inguinal Hernias  The various methods of treatment have been discussed with the patient and family. After consideration of risks, benefits and other options for treatment, the patient has consented to  Procedure(s): Ceiba (Bilateral) as a surgical intervention .  The patient's history has been reviewed, patient examined, no change in status, stable for surgery.  I have reviewed the patient's chart and labs.  Questions were answered to the patient's satisfaction.     [place]

## 2018-05-01 NOTE — Anesthesia Preprocedure Evaluation (Addendum)
Anesthesia Evaluation  Patient identified by MRN, date of birth, ID band Patient awake    Reviewed: Allergy & Precautions, H&P , NPO status , Patient's Chart, lab work & pertinent test results  Airway Mallampati: III  TM Distance: >3 FB     Dental  (+) Poor Dentition, Chipped, Missing, Loose Very poor dentition with multiple loose teeth:   Pulmonary asthma , former smoker,           Cardiovascular hypertension,      Neuro/Psych negative neurological ROS  negative psych ROS   GI/Hepatic negative GI ROS, Neg liver ROS,   Endo/Other  diabetes  Renal/GU      Musculoskeletal   Abdominal   Peds  Hematology negative hematology ROS (+)   Anesthesia Other Findings Past Medical History: No date: Abdominal hernia No date: Diabetes mellitus (HCC)     Comment:  Pt taking Insulin No date: Enlarged prostate No date: Hyperlipidemia No date: Hypertension  Past Surgical History: No date: CIRCUMCISION  BMI    Body Mass Index:  29.60 kg/m      Reproductive/Obstetrics negative OB ROS                            Anesthesia Physical Anesthesia Plan  ASA: II  Anesthesia Plan: General ETT   Post-op Pain Management:    Induction:   PONV Risk Score and Plan: Ondansetron, Dexamethasone, Midazolam and Treatment may vary due to age or medical condition  Airway Management Planned:   Additional Equipment:   Intra-op Plan:   Post-operative Plan:   Informed Consent: I have reviewed the patients History and Physical, chart, labs and discussed the procedure including the risks, benefits and alternatives for the proposed anesthesia with the patient or authorized representative who has indicated his/her understanding and acceptance.     Dental Advisory Given  Plan Discussed with: Anesthesiologist and CRNA  Anesthesia Plan Comments:         Anesthesia Quick Evaluation

## 2018-05-01 NOTE — Anesthesia Post-op Follow-up Note (Signed)
Anesthesia QCDR form completed.        

## 2018-05-01 NOTE — Discharge Instructions (Addendum)
Laparoscopic Inguinal Hernia Repair, Adult, Care After This sheet gives you information about how to care for yourself after your procedure. Your health care provider may also give you more specific instructions. If you have problems or questions, contact your health care provider. What can I expect after the procedure? After the procedure, it is common to have:  Pain.  Swelling and bruising around the incision area.  Scrotal swelling, in men.  Some fluid or blood draining from your incisions. Follow these instructions at home: Incision care  Follow instructions from your health care provider about how to take care of your incisions. Make sure you: ? Wash your hands with soap and water before you change your bandage (dressing). If soap and water are not available, use hand sanitizer. ? Change your dressing as told by your health care provider. ? Leave stitches (sutures), skin glue, or adhesive strips in place. These skin closures may need to stay in place for 2 weeks or longer. If adhesive strip edges start to loosen and curl up, you may trim the loose edges. Do not remove adhesive strips completely unless your health care provider tells you to do that.  Check your incision area every day for signs of infection. Check for: ? More redness, swelling, or pain. ? More fluid or blood. ? Warmth. ? Pus or a bad smell.  Wear loose, soft clothing while your incisions heal. Driving  Do not drive or use heavy machinery while taking prescription pain medicine.  Do not drive for 24 hours if you were given a medicine to help you relax (sedative) during your procedure. Activity  Do not lift anything that is heavier than 10 lb (4.5 kg), or the limit that you are told, until your health care provider says that it is safe.  Ask your health care provider what activities are safe for you.A lot of activity during the first week after surgery can increase pain and swelling. For 1 week after your  procedure: ? Avoid activities that take a lot of effort, such as exercise or sports. ? You may walk and climb stairs as needed for daily activity, but avoid long walks or climbing stairs for exercise. Managing pain and swelling   Put ice on painful or swollen areas: ? Put ice in a plastic bag. ? Place a towel between your skin and the bag. ? Leave the ice on for 20 minutes, 2-3 times a day. General instructions  Do not take baths, swim, or use a hot tub until your health care provider approves. Ask your health care provider if you may take showers. You may only be allowed to take sponge baths.  Take over-the-counter and prescription medicines only as told by your health care provider.  To prevent or treat constipation while you are taking prescription pain medicine, your health care provider may recommend that you: ? Drink enough fluid to keep your urine pale yellow. ? Take over-the-counter or prescription medicines. ? Eat foods that are high in fiber, such as fresh fruits and vegetables, whole grains, and beans. ? Limit foods that are high in fat and processed sugars, such as fried and sweet foods.  Do not use any products that contain nicotine or tobacco, such as cigarettes and e-cigarettes. If you need help quitting, ask your health care provider.  Drink enough fluid to keep your urine pale yellow.  Keep all follow-up visits as told by your health care provider. This is important. Contact a health care provider if:  You  have more redness, swelling, or pain around your incisions or your groin area.  You have more swelling in your scrotum.  You have more fluid or blood coming from your incisions.  Your incisions feel warm to the touch.  You have severe pain and medicines do not help.  You have abdominal pain or swelling.  You cannot eat or drink without vomiting.  You cannot urinate or pass a bowel movement.  You faint.  You feel dizzy.  You have nausea and  vomiting.  You have a fever. Get help right away if:  You have pus or a bad smell coming from your incisions.  You have chest pain.  You have problems breathing. Summary  Pain, swelling, and bruising are common after the procedure.  Check your incision area every day for signs of infection, such as more redness, swelling, or pain.  Put ice on painful or swollen areas for 20 minutes, 2-3 times a day. This information is not intended to replace advice given to you by your health care provider. Make sure you discuss any questions you have with your health care provider.   AMBULATORY SURGERY  DISCHARGE INSTRUCTIONS   1) The drugs that you were given will stay in your system until tomorrow so for the next 24 hours you should not:  A) Drive an automobile B) Make any legal decisions C) Drink any alcoholic beverage   2) You may resume regular meals tomorrow.  Today it is better to start with liquids and gradually work up to solid foods.  You may eat anything you prefer, but it is better to start with liquids, then soup and crackers, and gradually work up to solid foods.   3) Please notify your doctor immediately if you have any unusual bleeding, trouble breathing, redness and pain at the surgery site, drainage, fever, or pain not relieved by medication.    4) Additional Instructions:        Please contact your physician with any problems or Same Day Surgery at [phone], Monday through Friday 6 am to 4 pm, or [facility] at [facility] number at [phone]. Document Released: 05/31/2016 Document Revised: 05/31/2016 Document Reviewed: 05/31/2016 Elsevier Interactive Patient Education  2019 [employer].

## 2018-05-02 LAB — SURGICAL PATHOLOGY

## 2018-05-07 ENCOUNTER — Telehealth: Payer: Self-pay

## 2018-05-07 ENCOUNTER — Other Ambulatory Visit: Payer: Self-pay

## 2018-05-07 NOTE — Telephone Encounter (Signed)
Contacted patient and spoke with his wife to inform them that anesthesia is not covered with insurance.  Wife said that she would like to keep colonoscopy as scheduled.  Thanks [employer]

## 2018-05-14 ENCOUNTER — Encounter: Payer: Self-pay | Admitting: Surgery

## 2018-05-14 ENCOUNTER — Other Ambulatory Visit: Payer: Self-pay

## 2018-05-14 ENCOUNTER — Ambulatory Visit (INDEPENDENT_AMBULATORY_CARE_PROVIDER_SITE_OTHER): Payer: Medicare Other | Admitting: Surgery

## 2018-05-14 VITALS — BP 126/75 | HR 81 | Temp 97.7°F | Resp 16 | Ht 62.0 in | Wt 154.4 lb

## 2018-05-14 DIAGNOSIS — Z09 Encounter for follow-up examination after completed treatment for conditions other than malignant neoplasm: Secondary | ICD-10-CM

## 2018-05-14 NOTE — Patient Instructions (Addendum)
The patient is aware to call back for any questions or new concerns. May change band aid as needed. Resume activities as tolerated. Follow up in 2 weeks
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
## 2018-05-15 ENCOUNTER — Other Ambulatory Visit: Payer: Self-pay

## 2018-05-15 ENCOUNTER — Encounter: Payer: Self-pay | Admitting: Student

## 2018-05-15 ENCOUNTER — Ambulatory Visit: Payer: Medicare Other | Admitting: Registered Nurse

## 2018-05-15 ENCOUNTER — Ambulatory Visit
Admission: RE | Admit: 2018-05-15 | Discharge: 2018-05-15 | Disposition: A | Discharge status: Home / Self Care | Payer: Medicare Other | Attending: Gastroenterology | Admitting: Gastroenterology

## 2018-05-15 ENCOUNTER — Encounter
Admission: RE | Disposition: A | Discharge status: Home / Self Care | Payer: Self-pay | Source: Home / Self Care | Attending: Gastroenterology

## 2018-05-15 DIAGNOSIS — Z888 Allergy status to other drugs, medicaments and biological substances status: Secondary | ICD-10-CM | POA: Insufficient documentation

## 2018-05-15 DIAGNOSIS — Z87891 Personal history of nicotine dependence: Secondary | ICD-10-CM | POA: Diagnosis not present

## 2018-05-15 DIAGNOSIS — E785 Hyperlipidemia, unspecified: Secondary | ICD-10-CM | POA: Diagnosis not present

## 2018-05-15 DIAGNOSIS — N400 Enlarged prostate without lower urinary tract symptoms: Secondary | ICD-10-CM | POA: Diagnosis not present

## 2018-05-15 DIAGNOSIS — Z1211 Encounter for screening for malignant neoplasm of colon: Secondary | ICD-10-CM | POA: Diagnosis present

## 2018-05-15 DIAGNOSIS — D126 Benign neoplasm of colon, unspecified: Secondary | ICD-10-CM | POA: Insufficient documentation

## 2018-05-15 DIAGNOSIS — E119 Type 2 diabetes mellitus without complications: Secondary | ICD-10-CM | POA: Insufficient documentation

## 2018-05-15 DIAGNOSIS — Z79899 Other long term (current) drug therapy: Secondary | ICD-10-CM | POA: Diagnosis not present

## 2018-05-15 DIAGNOSIS — Z794 Long term (current) use of insulin: Secondary | ICD-10-CM | POA: Insufficient documentation

## 2018-05-15 DIAGNOSIS — Z7982 Long term (current) use of aspirin: Secondary | ICD-10-CM | POA: Insufficient documentation

## 2018-05-15 DIAGNOSIS — I10 Essential (primary) hypertension: Secondary | ICD-10-CM | POA: Diagnosis not present

## 2018-05-15 HISTORY — PX: COLONOSCOPY WITH PROPOFOL: SHX5780

## 2018-05-15 LAB — GLUCOSE, CAPILLARY: Glucose-Capillary: 238 mg/dL — ABNORMAL HIGH (ref 70–99)

## 2018-05-15 SURGERY — COLONOSCOPY WITH PROPOFOL
Anesthesia: General

## 2018-05-15 MED ORDER — SODIUM CHLORIDE 0.9 % IV SOLN
INTRAVENOUS | Status: DC | PRN
  Administered 2018-05-15: 09:00:00 via INTRAVENOUS

## 2018-05-15 MED ORDER — PROPOFOL 500 MG/50ML IV EMUL
INTRAVENOUS | Status: AC
  Filled 2018-05-15: qty 50

## 2018-05-15 MED ORDER — PROPOFOL 500 MG/50ML IV EMUL
INTRAVENOUS | Status: DC | PRN
  Administered 2018-05-15: 150 ug/kg/min via INTRAVENOUS

## 2018-05-15 MED ORDER — MIDAZOLAM HCL 2 MG/2ML IJ SOLN
INTRAMUSCULAR | Status: AC
  Filled 2018-05-15: qty 2

## 2018-05-15 MED ORDER — PROPOFOL 10 MG/ML IV BOLUS
INTRAVENOUS | Status: DC | PRN
  Administered 2018-05-15: 80 mg via INTRAVENOUS

## 2018-05-15 NOTE — H&P (Signed)
Malik Darby, MD [address]  [place], [place] [address]  Main: [phone]  Fax: [phone] Pager: [phone]  Primary Care Physician:  Kerri Perches, PA-C Primary Gastroenterologist:  Dr. Cephas Bridges  Pre-Procedure History & Physical: HPI:  Malik Bridges is a 68 y.o. male is here for an colonoscopy.   Past Medical History:  Diagnosis Date  . Abdominal hernia   . Diabetes mellitus ([place])    Pt taking Insulin  . Enlarged prostate   . Hyperlipidemia   . Hypertension     Past Surgical History:  Procedure Laterality Date  . CIRCUMCISION    . ROBOT ASSISTED INGUINAL HERNIA REPAIR Bilateral 05/01/2018   Procedure: ROBOT ASSISTED INGUINAL HERNIA REPAIR;  Surgeon: Jules Husbands, MD;  Location: [facility] ORS;  Service: General;  Laterality: Bilateral;  . UMBILICAL HERNIA REPAIR  05/01/2018   Procedure: HERNIA REPAIR UMBILICAL ADULT;  Surgeon: Jules Husbands, MD;  Location: [facility] ORS;  Service: General;;    Prior to Admission medications   Medication Sig Start Date End Date Taking? Authorizing Provider  albuterol (PROVENTIL HFA;VENTOLIN HFA) 108 (90 Base) MCG/ACT inhaler Inhale 2 puffs into the lungs every 4 (four) hours as needed.  01/03/17  Yes [provider]  aspirin EC 81 MG tablet Take 81 mg by mouth daily.  01/03/17  Yes [provider]  atorvastatin (LIPITOR) 20 MG tablet Take 20 mg by mouth daily.  01/03/17  Yes [provider]  glucose blood (PRECISION QID TEST) test strip Check twice a day, E11.42 07/14/15  Yes [provider]  insulin glargine (LANTUS) 100 UNIT/ML injection Inject 35 Units into the skin at bedtime.   Yes [provider]  lisinopril (PRINIVIL,ZESTRIL) 20 MG tablet Take 20 mg by mouth daily.  01/03/17  Yes [provider]  metFORMIN (GLUCOPHAGE) 1000 MG tablet Take 1,000 mg by mouth 2 (two) times daily with a meal.  01/03/17  Yes [provider]  [place]. Devices (DELUXE TABLET  CUTTER) MISC Use to cut metformin tablets. 11/17/14  Yes [provider]  nicotine polacrilex (NICORETTE) 2 MG gum Take by mouth. 01/03/17  Yes [provider]  spironolactone (ALDACTONE) 25 MG tablet Take 12.5 mg by mouth daily.  01/03/17  Yes [provider]  tamsulosin (FLOMAX) 0.4 MG CAPS capsule Take 0.4 mg by mouth daily.  01/03/17  Yes [provider]    Allergies as of 04/09/2018 - Review Complete 04/04/2018  Allergen Reaction Noted  . Hydrochlorothiazide Other (See Comments) 03/16/2014    History reviewed. No pertinent family history.  Social History   Socioeconomic History  . Marital status: Married    Spouse name: Not on file  . Number of children: Not on file  . Years of education: Not on file  . Highest education level: Not on file  Occupational History  . Not on file  Social Needs  . Financial resource strain: Not on file  . Food insecurity:    Worry: Not on file    Inability: Not on file  . Transportation needs:    Medical: Not on file    Non-medical: Not on file  Tobacco Use  . Smoking status: Former Smoker    Types: Cigarettes  . Smokeless tobacco: Current User    Types: Snuff  Substance and Sexual Activity  . Alcohol use: Not Currently  . Drug use: Never  . Sexual activity: Yes    Partners: Female  Lifestyle  . Physical activity:  Days per week: Not on file    Minutes per session: Not on file  . Stress: Not on file  Relationships  . Social connections:    Talks on phone: Not on file    Gets together: Not on file    Attends religious service: Not on file    Active member of club or organization: Not on file    Attends meetings of clubs or organizations: Not on file    Relationship status: Not on file  . Intimate partner violence:    Fear of current or ex partner: Not on file    Emotionally abused: Not on file    Physically abused: Not on file    Forced sexual activity: Not on file  Other Topics Concern  .  Not on file  Social History Narrative  . Not on file    Review of Systems: See HPI, otherwise negative ROS  Physical Exam: BP 106/62   Pulse 79   Temp (!) 97.2 F (36.2 C) (Tympanic)   Resp 16   Ht 5\' 2"  (1.575 m)   Wt 137 lb (62.1 kg)   SpO2 100%   BMI 25.06 kg/m  General:   Alert,  pleasant and cooperative in NAD Head:  Normocephalic and atraumatic. Neck:  Supple; no masses or thyromegaly. Lungs:  Clear throughout to auscultation.    Heart:  Regular rate and rhythm. Abdomen:  Soft, nontender and nondistended. Normal bowel sounds, without guarding, and without rebound.   Neurologic:  Alert and  oriented x4;  grossly normal neurologically.  Impression/Plan: Malik Bridges is here for an colonoscopy to be performed for colon cancer screening  Risks, benefits, limitations, and alternatives regarding  colonoscopy have been reviewed with the patient.  Questions have been answered.  All parties agreeable.   Sherri Sear, MD  05/15/2018, 10:16 AM

## 2018-05-15 NOTE — Anesthesia Preprocedure Evaluation (Addendum)
Anesthesia Evaluation  Patient identified by MRN, date of birth, ID band Patient awake    Reviewed: Allergy & Precautions, H&P , NPO status , Patient's Chart, lab work & pertinent test results  Airway Mallampati: III  TM Distance: >3 FB     Dental  (+) Poor Dentition, Chipped, Missing, Loose Very poor dentition with multiple loose teeth:   Pulmonary asthma , former smoker,           Cardiovascular hypertension,      Neuro/Psych negative neurological ROS  negative psych ROS   GI/Hepatic negative GI ROS, Neg liver ROS,   Endo/Other  diabetes  Renal/GU      Musculoskeletal   Abdominal   Peds  Hematology negative hematology ROS (+)   Anesthesia Other Findings Past Medical History: No date: Abdominal hernia No date: Diabetes mellitus ([facility])     Comment:  Pt taking Insulin No date: Enlarged prostate No date: Hyperlipidemia No date: Hypertension  Past Surgical History: No date: CIRCUMCISION  BMI    Body Mass Index:  29.60 kg/m      Reproductive/Obstetrics negative OB ROS                             Anesthesia Physical  Anesthesia Plan  ASA: III  Anesthesia Plan:    Post-op Pain Management:    Induction:   PONV Risk Score and Plan: Ondansetron, Dexamethasone, Midazolam and Treatment may vary due to age or medical condition  Airway Management Planned: Nasal Cannula  Additional Equipment:   Intra-op Plan:   Post-operative Plan:   Informed Consent: I have reviewed the patients History and Physical, chart, labs and discussed the procedure including the risks, benefits and alternatives for the proposed anesthesia with the patient or authorized representative who has indicated his/her understanding and acceptance.     Dental Advisory Given  Plan Discussed with: Anesthesiologist and CRNA  Anesthesia Plan Comments:        Anesthesia Quick Evaluation

## 2018-05-15 NOTE — Anesthesia Post-op Follow-up Note (Signed)
Anesthesia QCDR form completed.        

## 2018-05-15 NOTE — Op Note (Signed)
[facility] Gastroenterology Patient Name: Malik Bridges Procedure Date: 05/15/2018 7:28 AM MRN: [id number] Account #: [id number] Date of Birth: 03-01-1951 Admit Type: Outpatient Age: 68 Room: [facility] ENDO ROOM 3 Gender: Male Note Status: Finalized Procedure:            Colonoscopy Indications:          Screening for colorectal malignant neoplasm Providers:            Lin Landsman MD, MD Referring MD:         No Local Md, MD (Referring MD) Medicines:            Monitored Anesthesia Care Complications:        No immediate complications. Estimated blood loss: None. Procedure:            Pre-Anesthesia Assessment:                       - Prior to the procedure, a History and Physical was                        performed, and patient medications and allergies were                        reviewed. The patient is competent. The risks and                        benefits of the procedure and the sedation options and                        risks were discussed with the patient. All questions                        were answered and informed consent was obtained.                        Patient identification and proposed procedure were                        verified by the physician, the nurse, the                        anesthesiologist, the anesthetist and the technician in                        the pre-procedure area in the procedure room in the                        endoscopy suite. Mental Status Examination: alert and                        oriented. Airway Examination: normal oropharyngeal                        airway and neck mobility. Respiratory Examination:                        clear to auscultation. CV Examination: normal.                        Prophylactic Antibiotics: The patient does not require  prophylactic antibiotics. Prior Anticoagulants: The                        patient has taken no previous anticoagulant or           antiplatelet agents. ASA Grade Assessment: III - A                        patient with severe systemic disease. After reviewing                        the risks and benefits, the patient was deemed in                        satisfactory condition to undergo the procedure. The                        anesthesia plan was to use monitored anesthesia care                        (MAC). Immediately prior to administration of                        medications, the patient was re-assessed for adequacy                        to receive sedatives. The heart rate, respiratory rate,                        oxygen saturations, blood pressure, adequacy of                        pulmonary ventilation, and response to care were                        monitored throughout the procedure. The physical status                        of the patient was re-assessed after the procedure.                       After obtaining informed consent, the colonoscope was                        passed under direct vision. Throughout the procedure,                        the patient's blood pressure, pulse, and oxygen                        saturations were monitored continuously. The                        Colonoscope was introduced through the anus and                        advanced to the the cecum, identified by appendiceal                        orifice and ileocecal valve. The colonoscopy was  technically difficult and complex due to significant                        looping. Successful completion of the procedure was                        aided by applying abdominal pressure. The patient                        tolerated the procedure well. The quality of the bowel                        preparation was evaluated using the BBPS [facility] Bowel                        Preparation Scale) with scores of: Right Colon = 2                        (minor amount of residual staining, small fragments  of                        stool and/or opaque liquid, but mucosa seen well),                        Transverse Colon = 3 (entire mucosa seen well with no                        residual staining, small fragments of stool or opaque                        liquid) and Left Colon = 3 (entire mucosa seen well                        with no residual staining, small fragments of stool or                        opaque liquid). The total BBPS score equals 8. Findings:      The perianal and digital rectal examinations were normal. Pertinent       negatives include normal sphincter tone and no palpable rectal lesions.      Two sessile polyps were found in the ascending colon. The polyps were 5       mm in size. These polyps were removed with a cold snare. Resection and       retrieval were complete.      The retroflexed view of the distal rectum and anal verge was normal and       showed no anal or rectal abnormalities. Impression:           - Two 5 mm polyps in the ascending colon, removed with                        a cold snare. Resected and retrieved.                       - The distal rectum and anal verge are normal on                        retroflexion view.  Recommendation:       - Discharge patient to home (with escort).                       - Resume previous diet today.                       - Continue present medications.                       - Await pathology results.                       - Repeat colonoscopy in 7 years for surveillance based                        on pathology results. Procedure Code(s):    --- Professional ---                       [phone], Colonoscopy, flexible; with removal of tumor(s),                        polyp(s), or other lesion(s) by snare technique Diagnosis Code(s):    --- Professional ---                       Z12.11, Encounter for screening for malignant neoplasm                        of colon                       D12.2, Benign neoplasm of ascending  colon CPT copyright 2018 American Medical Association. All rights reserved. The codes documented in this report are preliminary and upon coder review may  be revised to meet current compliance requirements. Dr. Ulyess Mort Lin Landsman MD, MD 05/15/2018 9:12:42 AM This report has been signed electronically. Number of Addenda: 0 Note Initiated On: 05/15/2018 7:28 AM Scope Withdrawal Time: 0 hours 13 minutes 0 seconds  Total Procedure Duration: 0 hours 17 minutes 35 seconds       [facility]

## 2018-05-16 LAB — SURGICAL PATHOLOGY

## 2018-05-20 ENCOUNTER — Encounter: Payer: Self-pay | Admitting: Gastroenterology

## 2018-05-20 NOTE — Anesthesia Postprocedure Evaluation (Signed)
Anesthesia Post Note  Patient: Malik Bridges  Procedure(s) Performed: COLONOSCOPY WITH PROPOFOL (N/A )  Patient location during evaluation: PACU Anesthesia Type: General Level of consciousness: awake and alert and oriented Pain management: pain level controlled Vital Signs Assessment: post-procedure vital signs reviewed and stable Respiratory status: spontaneous breathing Cardiovascular status: blood pressure returned to baseline Anesthetic complications: no     Last Vitals:  Vitals:   05/15/18 0845  BP: 106/62  Pulse: 79  Resp: 16  Temp: (!) 36.2 C  SpO2: 100%    Last Pain:  Vitals:   05/16/18 0738  TempSrc:   PainSc: 0-No pain                 Jaspreet Hollings

## 2018-06-02 ENCOUNTER — Encounter: Payer: Medicare Other | Admitting: Surgery

## 2018-08-14 ENCOUNTER — Encounter: Payer: Self-pay | Admitting: Urology

## 2018-08-14 ENCOUNTER — Ambulatory Visit: Payer: Medicare Other | Admitting: Urology

## 2018-10-01 ENCOUNTER — Ambulatory Visit: Payer: Medicare Other | Admitting: Urology

## 2018-10-01 ENCOUNTER — Ambulatory Visit (INDEPENDENT_AMBULATORY_CARE_PROVIDER_SITE_OTHER): Payer: Medicare Other | Admitting: Urology

## 2018-10-01 ENCOUNTER — Other Ambulatory Visit: Payer: Self-pay

## 2018-10-01 ENCOUNTER — Encounter: Payer: Self-pay | Admitting: Urology

## 2018-10-01 VITALS — BP 138/73 | HR 66 | Ht 62.0 in | Wt 155.3 lb

## 2018-10-01 DIAGNOSIS — N430 Encysted hydrocele: Secondary | ICD-10-CM | POA: Diagnosis not present

## 2018-10-01 DIAGNOSIS — R3912 Poor urinary stream: Secondary | ICD-10-CM

## 2018-10-01 DIAGNOSIS — N401 Enlarged prostate with lower urinary tract symptoms: Secondary | ICD-10-CM | POA: Diagnosis not present

## 2018-10-01 LAB — MICROSCOPIC EXAMINATION
Bacteria, UA: NONE SEEN
Epithelial Cells (non renal): NONE SEEN /hpf (ref 0–10)
RBC, Urine: NONE SEEN /hpf (ref 0–2)

## 2018-10-01 LAB — URINALYSIS, COMPLETE
Bilirubin, UA: NEGATIVE
Leukocytes,UA: NEGATIVE
Nitrite, UA: NEGATIVE
RBC, UA: NEGATIVE
Specific Gravity, UA: 1.025 (ref 1.005–1.030)
Urobilinogen, Ur: 2 mg/dL — ABNORMAL HIGH (ref 0.2–1.0)
pH, UA: 6 (ref 5.0–7.5)

## 2018-10-01 NOTE — Patient Instructions (Signed)
Hydrocele, Adult  A hydrocele is a collection of fluid in the loose pouch of skin that holds the testicles (scrotum). This may happen because:  The amount of fluid produced in the scrotum is not absorbed by the rest of the body.  Fluid from the abdomen fills the scrotum. Normally, the testicles develop in the abdomen then move (drop) into to the scrotum before birth. The tube that the testicles travel through usually closes after the testicles drop. If the tube does not close, fluid from the abdomen can fill the scrotum. This is less common in adults.  What are the causes?  The cause of a hydrocele in adults is usually not known. However, it may be caused by:  An injury to the scrotum.  An infection (epididymitis).  Decreased blood flow to the scrotum.  Twisting of a testicle (testicular torsion).  A birth defect.  A tumor or cancer of the testicle.  What are the signs or symptoms?  A hydrocele feels like a water-filled balloon. It may also feel heavy. Other symptoms include:  Swelling of the scrotum. The swelling may decrease when you lie down. You may also notice more swelling at night than in the morning.  Swelling of the groin.  Mild discomfort in the scrotum.  Pain. This can develop if the hydrocele was caused by infection or twisting. The larger the hydrocele, the more likely you are to have pain.  How is this diagnosed?  This condition may be diagnosed based on:  Physical exam.  Medical history.  You may also have other tests, including:  Imaging tests, such as ultrasound.  Blood or urine tests.  How is this treated?  Most hydroceles go away on their own. If you have no discomfort or pain, your health care provider may suggest close monitoring of your condition (called watch and wait or watchful waiting) until the condition goes away or symptoms develop. If treatment is needed, it may include:  Treating an underlying condition. This may include using an antibiotic medicine to treat an infection.  Surgery to  stop fluid from collecting in the scrotum.  Surgery to drain the fluid. Options include:  Needle aspiration. A needle is used to drain fluid. However, the fluid buildup will come back quickly.  Hydrocelectomy. For this procedure, an incision is made in the scrotum to remove the fluid sac.  Follow these instructions at home:  Watch the hydrocele for any changes.  Take over-the-counter and prescription medicines only as told by your health care provider.  If you were prescribed an antibiotic medicine, use it as told by your health care provider. Do not stop taking the antibiotic even if you start to feel better.  Keep all follow-up visits as told by your health care provider. This is important.  Contact a health care provider if:  You notice any changes in the hydrocele.  The swelling in your scrotum or groin gets worse.  The hydrocele becomes red, firm, painful, or tender to the touch.  You have a fever.  Get help right away if you:  Develop a lot of pain, or your pain becomes worse.  Summary  A hydrocele is a collection of fluid in the loose pouch of skin that holds the testicles (scrotum).  Hydroceles can cause swelling, discomfort, and sometimes pain.  In adults, the cause of a hydrocele usually is not known. However, it is sometimes caused by an infection or a rotation and twisting of the scrotum.  Treatment is usually not   may be given to ease the pain. This information is not intended to replace advice given to you by your health care provider. Make sure you discuss any questions you have with your health care provider. Document Released: 08/09/2009 Document Revised: 03/02/2017 Document Reviewed: 03/02/2017 Elsevier Patient Education  2020 [employer]. Benign Prostatic Hyperplasia  Benign prostatic hyperplasia (BPH) is an enlarged prostate gland that is  caused by the normal aging process and not by cancer. The prostate is a walnut-sized gland that is involved in the production of semen. It is located in front of the rectum and below the bladder. The bladder stores urine and the urethra is the tube that carries the urine out of the body. The prostate may get bigger as a man gets older. An enlarged prostate can press on the urethra. This can make it harder to pass urine. The build-up of urine in the bladder can cause infection. Back pressure and infection may progress to bladder damage and kidney (renal) failure. What are the causes? This condition is part of a normal aging process. However, not all men develop problems from this condition. If the prostate enlarges away from the urethra, urine flow will not be blocked. If it enlarges toward the urethra and compresses it, there will be problems passing urine. What increases the risk? This condition is more likely to develop in men over the age of 74 years. What are the signs or symptoms? Symptoms of this condition include:  Getting up often during the night to urinate.  Needing to urinate frequently during the day.  Difficulty starting urine flow.  Decrease in size and strength of your urine stream.  Leaking (dribbling) after urinating.  Inability to pass urine. This needs immediate treatment.  Inability to completely empty your bladder.  Pain when you pass urine. This is more common if there is also an infection.  Urinary tract infection (UTI). How is this diagnosed? This condition is diagnosed based on your medical history, a physical exam, and your symptoms. Tests will also be done, such as:  A post-void bladder scan. This measures any amount of urine that may remain in your bladder after you finish urinating.  A digital rectal exam. In a rectal exam, your health care provider checks your prostate by putting a lubricated, gloved finger into your rectum to feel the back of your  prostate gland. This exam detects the size of your gland and any abnormal lumps or growths.  An exam of your urine (urinalysis).  A prostate specific antigen (PSA) screening. This is a blood test used to screen for prostate cancer.  An ultrasound. This test uses sound waves to electronically produce a picture of your prostate gland. Your health care provider may refer you to a specialist in kidney and prostate diseases (urologist). How is this treated? Once symptoms begin, your health care provider will monitor your condition (active surveillance or watchful waiting). Treatment for this condition will depend on the severity of your condition. Treatment may include:  Observation and yearly exams. This may be the only treatment needed if your condition and symptoms are mild.  Medicines to relieve your symptoms, including: ? Medicines to shrink the prostate. ? Medicines to relax the muscle of the prostate.  Surgery in severe cases. Surgery may include: ? Prostatectomy. In this procedure, the prostate tissue is removed completely through an open incision or with a laparoscope or robotics. ? Transurethral resection of the prostate (TURP). In this procedure, a tool is inserted through the  opening at the tip of the penis (urethra). It is used to cut away tissue of the inner core of the prostate. The pieces are removed through the same opening of the penis. This removes the blockage. ? Transurethral incision (TUIP). In this procedure, small cuts are made in the prostate. This lessens the prostate's pressure on the urethra. ? Transurethral microwave thermotherapy (TUMT). This procedure uses microwaves to create heat. The heat destroys and removes a small amount of prostate tissue. ? Transurethral needle ablation (TUNA). This procedure uses radio frequencies to destroy and remove a small amount of prostate tissue. ? Interstitial laser coagulation ([place]). This procedure uses a laser to destroy and remove  a small amount of prostate tissue. ? Transurethral electrovaporization (TUVP). This procedure uses electrodes to destroy and remove a small amount of prostate tissue. ? Prostatic urethral lift. This procedure inserts an implant to push the lobes of the prostate away from the urethra. Follow these instructions at home:  Take over-the-counter and prescription medicines only as told by your health care provider.  Monitor your symptoms for any changes. Contact your health care provider with any changes.  Avoid drinking large amounts of liquid before going to bed or out in public.  Avoid or reduce how much caffeine or alcohol you drink.  Give yourself time when you urinate.  Keep all follow-up visits as told by your health care provider. This is important. Contact a health care provider if:  You have unexplained back pain.  Your symptoms do not get better with treatment.  You develop side effects from the medicine you are taking.  Your urine becomes very dark or has a bad smell.  Your lower abdomen becomes distended and you have trouble passing your urine. Get help right away if:  You have a fever or chills.  You suddenly cannot urinate.  You feel lightheaded, or very dizzy, or you faint.  There are large amounts of blood or clots in the urine.  Your urinary problems become hard to manage.  You develop moderate to severe low back or flank pain. The flank is the side of your body between the ribs and the hip. These symptoms may represent a serious problem that is an emergency. Do not wait to see if the symptoms will go away. Get medical help right away. Call your local emergency services (911 in the [place] Do not drive yourself to the hospital. Summary  Benign prostatic hyperplasia (BPH) is an enlarged prostate that is caused by the normal aging process and not by cancer.  An enlarged prostate can press on the urethra. This can make it hard to pass urine.  This condition is  part of a normal aging process and is more likely to develop in men over the age of 69 years.  Get help right away if you suddenly cannot urinate. This information is not intended to replace advice given to you by your health care provider. Make sure you discuss any questions you have with your health care provider. Document Released: 02/19/2005 Document Revised: 01/14/2018 Document Reviewed: 03/26/2016 Elsevier Patient Education  2020 [employer].

## 2018-10-01 NOTE — Progress Notes (Signed)
10/01/2018 1:18 PM   [name] 1950/03/14 [id number]  Referring provider: Kerri Perches, PA-C [place] [address] [place] [address] [place],  [place] [address]  No chief complaint on file.   HPI:  Mr. Deeg was referred for BPH.  He underwent CT scan of the abdomen and pelvis on 01/09/2018 which revealed about an 80 g prostate.  There was no lymphadenopathy or bony lesions. He has intermittent flow. He has urgency. No gross hematuria. No h/o PSA test. No FH PCa. He is on tamsulosin.  A scrotal US showed right inguinal hernia and "bilateral hydroceles - R > L".  The hydroceles are heavy and bothersome. He has occasional weak stream.   Modifying factors: There are no other modifying factors  Associated signs and symptoms: There are no other associated signs and symptoms Aggravating and relieving factors: There are no other aggravating or relieving factors Severity: Moderate Duration: Persistent  PMH: Past Medical History:  Diagnosis Date   Abdominal hernia    Diabetes mellitus ([place])    Pt taking Insulin   Enlarged prostate    Hyperlipidemia    Hypertension     Surgical History: Past Surgical History:  Procedure Laterality Date   CIRCUMCISION     COLONOSCOPY WITH PROPOFOL N/A 05/15/2018   Procedure: COLONOSCOPY WITH PROPOFOL;  Surgeon: Lin Landsman, MD;  Location: [facility] ENDOSCOPY;  Service: Gastroenterology;  Laterality: N/A;   ROBOT ASSISTED INGUINAL HERNIA REPAIR Bilateral 05/01/2018   Procedure: ROBOT ASSISTED INGUINAL HERNIA REPAIR;  Surgeon: Jules Husbands, MD;  Location: [facility] ORS;  Service: General;  Laterality: Bilateral;   UMBILICAL HERNIA REPAIR  05/01/2018   Procedure: HERNIA REPAIR UMBILICAL ADULT;  Surgeon: Jules Husbands, MD;  Location: [facility] ORS;  Service: General;;    Home Medications:  Allergies as of 10/01/2018      Reactions   Hydrochlorothiazide Other (See Comments)   K to 3.0 on Zestoretic 20-25      Medication List       Accurate as of October 01, 2018  1:18 PM. If you have any questions, ask your nurse or doctor.        albuterol 108 (90 Base) MCG/ACT inhaler Commonly known as: VENTOLIN HFA Inhale 2 puffs into the lungs every 4 (four) hours as needed.   aspirin EC 81 MG tablet Take 81 mg by mouth daily.   atorvastatin 20 MG tablet Commonly known as: LIPITOR Take 20 mg by mouth daily.   Deluxe Tablet Cutter Misc Use to cut metformin tablets.   insulin glargine 100 UNIT/ML injection Commonly known as: LANTUS Inject 35 Units into the skin at bedtime.   lisinopril 20 MG tablet Commonly known as: ZESTRIL Take 20 mg by mouth daily.   metFORMIN 1000 MG tablet Commonly known as: GLUCOPHAGE Take 1,000 mg by mouth 2 (two) times daily with a meal.   nicotine polacrilex 2 MG gum Commonly known as: NICORETTE Take by mouth.   Precision QID Test test strip Generic drug: glucose blood Check twice a day, E11.42   spironolactone 25 MG tablet Commonly known as: ALDACTONE Take 12.5 mg by mouth daily.   tamsulosin 0.4 MG Caps capsule Commonly known as: FLOMAX Take 0.4 mg by mouth daily.       Allergies:  Allergies  Allergen Reactions   Hydrochlorothiazide Other (See Comments)    K to 3.0 on Zestoretic 20-25    Family History: No family history on file.  Social History:  reports that he has quit smoking. His  smoking use included cigarettes. His smokeless tobacco use includes snuff. He reports previous alcohol use. He reports that he does not use drugs.  ROS:                                        Physical Exam: There were no vitals taken for this visit.  Constitutional:  Alert and oriented, No acute distress. HEENT: [place] AT, moist mucus membranes.  Trachea midline, no masses. Cardiovascular: No clubbing, cyanosis, or edema. Respiratory: Normal respiratory effort, no increased work of breathing. GI: Abdomen is soft, nontender, nondistended, no abdominal masses GU: Penis circumcised,  normal foreskin, testicles descended bilaterally and palpably normal, bilateral epididymis palpably normal, scrotum normal, large right hydrocele, smaller left hydrocele DRE: Prostate 60 g, smooth without hard area or nodule - not the easiest prostate to examine Lymph: No inguinal lymphadenopathy. Skin: No rashes, bruises or suspicious lesions. Neurologic: Grossly intact, no focal deficits, moving all 4 extremities. Psychiatric: Normal mood and affect.  Laboratory Data: Lab Results  Component Value Date   WBC 4.2 04/21/2018   HGB 17.3 (H) 05/01/2018   HCT 51.0 05/01/2018   MCV 83.3 04/21/2018   PLT 197 04/21/2018    Lab Results  Component Value Date   CREATININE 0.85 04/21/2018    No results found for: PSA  No results found for: TESTOSTERONE  No results found for: HGBA1C  Urinalysis No results found for: COLORURINE, APPEARANCEUR, LABSPEC, PHURINE, GLUCOSEU, HGBUR, BILIRUBINUR, KETONESUR, PROTEINUR, UROBILINOGEN, NITRITE, LEUKOCYTESUR  No results found for: LABMICR, [place], RBCUA, LABEPIT, MUCUS, BACTERIA  Pertinent Imaging: Scrotal US and CT  No results found for this or any previous visit. No results found for this or any previous visit. No results found for this or any previous visit. No results found for this or any previous visit. No results found for this or any previous visit. No results found for this or any previous visit. No results found for this or any previous visit. No results found for this or any previous visit.  Assessment & Plan:    BPH - LUTS - on tamsulosin. Check PSA. Consider adding 5ari or HoLEP  -- discussed with patient.   Hydrocele - discussed the nature r/b/a to surgical repair and he is interested. Will consider. We could do the hydrocele with a prostate procedure.   No follow-ups on file.  Festus Aloe, MD  [facility] [address] [place], [place] [address] [phone]

## 2018-10-02 ENCOUNTER — Telehealth: Payer: Self-pay | Admitting: Family Medicine

## 2018-10-02 DIAGNOSIS — R972 Elevated prostate specific antigen [PSA]: Secondary | ICD-10-CM

## 2018-10-02 LAB — PSA: Prostate Specific Ag, Serum: 5.2 ng/mL — ABNORMAL HIGH (ref 0.0–4.0)

## 2018-10-02 NOTE — Telephone Encounter (Signed)
Unable to leave a message, no voicemail.  

## 2018-10-02 NOTE — Telephone Encounter (Signed)
-----   Message from Festus Aloe, MD sent at 10/02/2018 11:38 AM EDT ----- Malik Bridges -- let patient know his PSA is slightly elevated and I'd like to repeat it in 4-6 weeks prior to his follow-up on Sept 9th to make sure the PSA decrease. We may need to consider a prostate biopsy.   ----- Message ----- From: Kyra Manges, CMA Sent: 10/02/2018   8:11 AM EDT To: Festus Aloe, MD   ----- Message ----- From: Lavone Neri Lab Results In Sent: 10/01/2018   4:37 PM EDT To: [facility]

## 2018-10-06 NOTE — Telephone Encounter (Signed)
PSA ordered

## 2018-10-06 NOTE — Telephone Encounter (Signed)
Called pt informed him of the information below. He states that he will relay the information to his wife and call us back.

## 2018-11-12 ENCOUNTER — Ambulatory Visit: Payer: Medicare Other | Admitting: Urology

## 2018-12-08 ENCOUNTER — Encounter: Payer: Self-pay | Admitting: Urology

## 2018-12-08 ENCOUNTER — Other Ambulatory Visit: Payer: Medicare Other

## 2018-12-10 ENCOUNTER — Encounter: Payer: Self-pay | Admitting: Urology

## 2018-12-10 ENCOUNTER — Ambulatory Visit: Payer: Medicare Other | Admitting: Urology

## 2019-05-04 DIAGNOSIS — R972 Elevated prostate specific antigen [PSA]: Secondary | ICD-10-CM

## 2019-05-04 HISTORY — DX: Elevated prostate specific antigen (PSA): R97.20

## 2019-05-13 ENCOUNTER — Other Ambulatory Visit: Payer: Medicare Other

## 2019-05-13 ENCOUNTER — Other Ambulatory Visit: Payer: Self-pay

## 2019-05-13 DIAGNOSIS — R972 Elevated prostate specific antigen [PSA]: Secondary | ICD-10-CM

## 2019-05-14 LAB — PSA: Prostate Specific Ag, Serum: 6.7 ng/mL — ABNORMAL HIGH (ref 0.0–4.0)

## 2019-05-15 ENCOUNTER — Telehealth: Payer: Self-pay | Admitting: *Deleted

## 2019-05-15 NOTE — Telephone Encounter (Signed)
-----   Message from Festus Aloe, MD sent at 05/15/2019  8:43 AM EST ----- Let pt know his PSA is elevated (a prostate test) and I need to see him back as planned 3/19 for a prostate exam. We can also check the hydroceles. Thanks.  ----- Message ----- From: Chrystie Nose, CMA Sent: 05/14/2019   7:43 AM EST To: Festus Aloe, MD   ----- Message ----- From: Lavone Neri Lab Results In Sent: 05/14/2019   5:38 AM EST To: Rowe Robert [facility]

## 2019-05-22 ENCOUNTER — Encounter: Payer: Self-pay | Admitting: Urology

## 2019-05-22 ENCOUNTER — Ambulatory Visit (INDEPENDENT_AMBULATORY_CARE_PROVIDER_SITE_OTHER): Payer: Medicare Other | Admitting: Urology

## 2019-05-22 ENCOUNTER — Other Ambulatory Visit: Payer: Self-pay

## 2019-05-22 VITALS — BP 129/73 | HR 73 | Ht 62.0 in | Wt 162.1 lb

## 2019-05-22 DIAGNOSIS — R972 Elevated prostate specific antigen [PSA]: Secondary | ICD-10-CM

## 2019-05-22 DIAGNOSIS — R3912 Poor urinary stream: Secondary | ICD-10-CM

## 2019-05-22 DIAGNOSIS — N401 Enlarged prostate with lower urinary tract symptoms: Secondary | ICD-10-CM | POA: Diagnosis not present

## 2019-05-22 DIAGNOSIS — N433 Hydrocele, unspecified: Secondary | ICD-10-CM

## 2019-05-22 LAB — URINALYSIS, COMPLETE
Bilirubin, UA: NEGATIVE
Ketones, UA: NEGATIVE
Leukocytes,UA: NEGATIVE
Nitrite, UA: NEGATIVE
RBC, UA: NEGATIVE
Specific Gravity, UA: 1.02 (ref 1.005–1.030)
Urobilinogen, Ur: 4 mg/dL — ABNORMAL HIGH (ref 0.2–1.0)
pH, UA: 6.5 (ref 5.0–7.5)

## 2019-05-22 LAB — BLADDER SCAN AMB NON-IMAGING

## 2019-05-22 LAB — MICROSCOPIC EXAMINATION
Bacteria, UA: NONE SEEN
RBC, Urine: NONE SEEN /hpf (ref 0–2)

## 2019-05-22 NOTE — Patient Instructions (Signed)

## 2019-05-22 NOTE — Progress Notes (Signed)
05/22/2019 9:56 AM   Malik Bridges [birth year] [id number]  Referring provider: Kerri Perches, PA-C [place],  [place] [address]  No chief complaint on file.   HPI:  F/u -   1) PSA elevation - pt seen for BPH Jul 2020 when a PSA was noted to be 5.2.  He did have a CT scan of the abdomen and pelvis on 01/09/2018 which revealed an 80 g prostate.  There was no lymphadenopathy or bony lesions.  He returns and PSA has increased to 6.10 May 2019.  2) BPH-as above prostate about 80 g on imaging.  He complained of intermittent flow and urgency.  Occasional weak stream.  He is on tamsulosin. AUASS = 15, QOL - unhappy.  3) hydroceles-scrotal ultrasound revealed a right inguinal hernia and bilateral right greater than left hydroceles.  The hydroceles are heavy and bothersome to the patient.  On exam he had a large right hydrocele and smaller left hydrocele.  Malik Bridges returns in evaluation of the above. PVR 0 ml. He continues tamsulosin. He feels like he voids too much. He c/o weak stream.    PMH: Past Medical History:  Diagnosis Date  . Abdominal hernia   . Diabetes mellitus ([place])    Pt taking Insulin  . Enlarged prostate   . Hyperlipidemia   . Hypertension     Surgical History: Past Surgical History:  Procedure Laterality Date  . CIRCUMCISION    . COLONOSCOPY WITH PROPOFOL N/A 05/15/2018   Procedure: COLONOSCOPY WITH PROPOFOL;  Surgeon: Lin Landsman, MD;  [facility];  Service: Gastroenterology;  Laterality: N/A;  . ROBOT ASSISTED INGUINAL HERNIA REPAIR Bilateral 05/01/2018   Procedure: ROBOT ASSISTED INGUINAL HERNIA REPAIR;  Surgeon: Jules Husbands, MD;  Location: [facility] ORS;  Service: General;  Laterality: Bilateral;  . UMBILICAL HERNIA REPAIR  05/01/2018   Procedure: HERNIA REPAIR UMBILICAL ADULT;  Surgeon: Jules Husbands, MD;  Location: [facility] ORS;  Service: General;;    Home Medications:  Allergies as of 05/22/2019      Reactions    Hydrochlorothiazide Other (See Comments)   K to 3.0 on Zestoretic 20-25      Medication List       Accurate as of May 22, 2019  9:56 AM. If you have any questions, ask your nurse or doctor.        albuterol 108 (90 Base) MCG/ACT inhaler Commonly known as: VENTOLIN HFA Inhale 2 puffs into the lungs every 4 (four) hours as needed.   aspirin EC 81 MG tablet Take 81 mg by mouth daily.   atorvastatin 20 MG tablet Commonly known as: LIPITOR Take 20 mg by mouth daily.   Deluxe Tablet Cutter Misc Use to cut metformin tablets.   insulin glargine 100 UNIT/ML injection Commonly known as: LANTUS Inject 35 Units into the skin at bedtime.   lisinopril 20 MG tablet Commonly known as: ZESTRIL Take 20 mg by mouth daily.   metFORMIN 1000 MG tablet Commonly known as: GLUCOPHAGE Take 1,000 mg by mouth 2 (two) times daily with a meal.   nicotine polacrilex 2 MG gum Commonly known as: NICORETTE Take by mouth.   potassium chloride SA 20 MEQ tablet Commonly known as: KLOR-CON   Precision QID Test test strip Generic drug: glucose blood Check twice a day, E11.42   spironolactone 25 MG tablet Commonly known as: ALDACTONE Take 12.5 mg by mouth daily.   tamsulosin 0.4 MG Caps capsule Commonly known as: FLOMAX Take  0.4 mg by mouth daily.       Allergies:  Allergies  Allergen Reactions  . Hydrochlorothiazide Other (See Comments)    K to 3.0 on Zestoretic 20-25    Family History: No family history on file.  Social History:  reports that he has quit smoking. His smoking use included cigarettes. His smokeless tobacco use includes snuff. He reports previous alcohol use. He reports that he does not use drugs.   Physical Exam: There were no vitals taken for this visit.  Constitutional:  Alert and oriented, No acute distress. HEENT: [place] AT, moist mucus membranes.  Trachea midline, no masses. Cardiovascular: No clubbing, cyanosis, or edema. Respiratory: Normal respiratory  effort, no increased work of breathing. GI: Abdomen is soft, nontender, nondistended, no abdominal masses GU: No CVA tenderness Skin: No rashes, bruises or suspicious lesions. Neurologic: Grossly intact, no focal deficits, moving all 4 extremities. Psychiatric: Normal mood and affect.  Laboratory Data: Lab Results  Component Value Date   WBC 4.2 04/21/2018   HGB 17.3 (H) 05/01/2018   HCT 51.0 05/01/2018   MCV 83.3 04/21/2018   PLT 197 04/21/2018    Lab Results  Component Value Date   CREATININE 0.85 04/21/2018    No results found for: PSA  No results found for: TESTOSTERONE  No results found for: HGBA1C  Urinalysis    Component Value Date/Time   APPEARANCEUR Hazy (A) 10/01/2018 1430   GLUCOSEU Trace (A) 10/01/2018 1430   BILIRUBINUR Negative 10/01/2018 1430   PROTEINUR 2+ (A) 10/01/2018 1430   NITRITE Negative 10/01/2018 1430   LEUKOCYTESUR Negative 10/01/2018 1430    Lab Results  Component Value Date   LABMICR See below: 10/01/2018   WBCUA 0-5 10/01/2018   LABEPIT None seen 10/01/2018   MUCUS Present (A) 10/01/2018   BACTERIA None seen 10/01/2018    Pertinent Imaging: n/a No results found for this or any previous visit. No results found for this or any previous visit. No results found for this or any previous visit. No results found for this or any previous visit. No results found for this or any previous visit. No results found for this or any previous visit. No results found for this or any previous visit. No results found for this or any previous visit.  Assessment & Plan:    1. Benign prostatic hyperplasia with weak urinary stream Continue tamsulosin. Will get some info re: prostate size with bx. - Urinalysis, Complete  2. Elevated PSA - I had a long discussion with the patient on the nature of elevated PSA - benign vs malignant causes. We discussed age specific levels and that PCa can be seen on a biopsy with very low PSA levels (<=2.5). We  discussed the nature risks and benefits of continued surveillance, other lab tests, imaging as well as prostate biopsy. We discussed the management of prostate cancer might include active surveillance or treatment depending on biopsy findings. All questions answered. He will proceed with bx.   3> hydroceles - could consider repair with prostate procedure.    No follow-ups on file.  Festus Aloe, MD  [facility] [address], [place], [place] [address] [phone]

## 2019-06-04 DIAGNOSIS — N433 Hydrocele, unspecified: Secondary | ICD-10-CM

## 2019-06-04 HISTORY — DX: Hydrocele, unspecified: N43.3

## 2019-06-19 ENCOUNTER — Encounter: Payer: Self-pay | Admitting: Urology

## 2019-06-19 ENCOUNTER — Ambulatory Visit (INDEPENDENT_AMBULATORY_CARE_PROVIDER_SITE_OTHER): Payer: Medicare Other | Admitting: Urology

## 2019-06-19 ENCOUNTER — Other Ambulatory Visit: Payer: Self-pay | Admitting: Urology

## 2019-06-19 ENCOUNTER — Other Ambulatory Visit: Payer: Self-pay

## 2019-06-19 VITALS — BP 171/79 | HR 69 | Ht 62.0 in | Wt 162.0 lb

## 2019-06-19 DIAGNOSIS — R972 Elevated prostate specific antigen [PSA]: Secondary | ICD-10-CM | POA: Diagnosis not present

## 2019-06-19 MED ORDER — LEVOFLOXACIN 500 MG PO TABS
500.0000 mg | ORAL_TABLET | Freq: Once | ORAL | Status: AC
  Administered 2019-06-19: 500 mg via ORAL

## 2019-06-19 MED ORDER — GENTAMICIN SULFATE 40 MG/ML IJ SOLN
80.0000 mg | Freq: Once | INTRAMUSCULAR | Status: AC
  Administered 2019-06-19: 80 mg via INTRAMUSCULAR

## 2019-06-19 NOTE — Patient Instructions (Signed)

## 2019-06-19 NOTE — Progress Notes (Signed)
He returns for prostate biopsy. Doing well. No fever or dysuria.   1) PSA elevation - pt seen for BPH Jul 2020 when a PSA was noted to be 5.2.  He did have a CT scan of the abdomen and pelvis on 01/09/2018 which revealed an 80 g prostate.  There was no lymphadenopathy or bony lesions.  PSA has increased to 6.10 May 2019.  2) BPH-as above prostate about 80 g on imaging.  He complained of intermittent flow and urgency.  Occasional weak stream.  He is on tamsulosin. AUASS = 15, QOL - unhappy. PVR 0 ml. Some frequency, weak stream.   3) hydroceles-scrotal ultrasound revealed a right inguinal hernia and bilateral right greater than left hydroceles.  The hydroceles are heavy and bothersome to the patient.  On exam he had a large right hydrocele and smaller left hydrocele.    Prostate Biopsy Procedure   Informed consent was obtained after discussing risks/benefits of the procedure.  A time out was performed to ensure correct patient identity.  Pre-Procedure: - Last PSA Level: No results found for: PSA - Gentamicin given prophylactically - Levaquin 500 mg administered PO -Transrectal Ultrasound performed revealing a 92.94 gram prostate (5.1 x 5.8 x 6 cm)  -DRE: 50 gm prostate - not an easy exam  GU: Right greater than left hydrocele  -No significant hypoechoic or median lobe noted  Procedure: - Prostate block performed using 10 cc 1% lidocaine and biopsies taken from sextant areas, a total of 12 under ultrasound guidance.  Charlott Rakes was assist.   Post-Procedure: - Patient tolerated the procedure well, but had a lot of discomfort, rectum very tight   - He was counseled to seek immediate medical attention if experiences any severe pain, significant bleeding, or fevers - Return in one week to discuss biopsy results

## 2019-06-22 ENCOUNTER — Telehealth: Payer: Self-pay | Admitting: Radiology

## 2019-06-22 NOTE — Telephone Encounter (Signed)
Called pt's wife, no answer. Unable to leave message as voicemail is full.

## 2019-06-22 NOTE — Telephone Encounter (Signed)
Pt returns call, he states that he is seeing blood at the beginning of his urinary stream and was concerned about this. Informed pt that it is normal to see blood in urine for several days post biopsy. Pt gave verbal understanding. Advised pt to call back or seek care in the ED if he develops fever, clots in urinary stream or pain. Pt gave verbal understanding.

## 2019-06-22 NOTE — Telephone Encounter (Signed)
Wife LMOM stating patient has had bleeding since biopsy on 06/19/2019. Please return call at [phone].

## 2019-06-29 LAB — ANATOMIC PATHOLOGY REPORT

## 2019-07-03 ENCOUNTER — Other Ambulatory Visit: Payer: Self-pay

## 2019-07-03 ENCOUNTER — Ambulatory Visit (INDEPENDENT_AMBULATORY_CARE_PROVIDER_SITE_OTHER): Payer: Medicare Other | Admitting: Urology

## 2019-07-03 ENCOUNTER — Encounter: Payer: Self-pay | Admitting: Urology

## 2019-07-03 ENCOUNTER — Other Ambulatory Visit: Payer: Self-pay | Admitting: Radiology

## 2019-07-03 VITALS — BP 167/81 | HR 64 | Ht 62.0 in | Wt 162.0 lb

## 2019-07-03 DIAGNOSIS — R972 Elevated prostate specific antigen [PSA]: Secondary | ICD-10-CM | POA: Diagnosis not present

## 2019-07-03 DIAGNOSIS — N430 Encysted hydrocele: Secondary | ICD-10-CM

## 2019-07-03 DIAGNOSIS — R3912 Poor urinary stream: Secondary | ICD-10-CM | POA: Diagnosis not present

## 2019-07-03 DIAGNOSIS — N401 Enlarged prostate with lower urinary tract symptoms: Secondary | ICD-10-CM | POA: Diagnosis not present

## 2019-07-03 DIAGNOSIS — N138 Other obstructive and reflux uropathy: Secondary | ICD-10-CM

## 2019-07-03 NOTE — Patient Instructions (Signed)
Current Urology, 10(1), 1-14. https://[email or web address]">  Hydrocelectomy, Adult, Care After This sheet gives you information about how to care for yourself after your procedure. Your health care provider may also give you more specific instructions. If you have problems or questions, contact your health care provider. What can I expect after the procedure? After your procedure, it is common to have:  Mild discomfort and swelling in the pouch that holds your testicles (scrotum).  Bruising of the scrotum. Follow these instructions at home: Medicines  Take over-the-counter and prescription medicines only as told by your health care provider.  Ask your health care provider if the medicine prescribed to you: ? Requires you to avoid driving or using heavy machinery. ? Can cause constipation. You may need to take these actions to prevent or treat constipation:  Drink enough fluid to keep your urine pale yellow.  Take over-the-counter or prescription medicines.  Eat foods that are high in fiber, such as beans, whole grains, and fresh fruits and vegetables.  Limit foods that are high in fat and processed sugars, such as fried or sweet foods. Bathing  Do not take baths, swim, or use a hot tub until your health care provider approves. Ask your health care provider if you may take showers. You may only be allowed to take sponge baths.  If you were told to wear an athletic support strap (scrotal support), keep it dry. Take it off when you shower or bathe. Incision care   Follow instructions from your health care provider about how to take care of your incision. Make sure you: ? Wash your hands with soap and water before and after you change your bandage (dressing). If soap and water are not available, use hand sanitizer. ? Change your dressing as told by your health care provider. ? Leave stitches (sutures), skin glue, or adhesive strips in place. These skin closures may need to stay  in place for 2 weeks or longer. If adhesive strip edges start to loosen and curl up, you may trim the loose edges. Do not remove adhesive strips completely unless your health care provider tells you to do that.  Check your incision and scrotum every day for signs of infection. Check for: ? More redness, swelling, or pain. ? Fluid or blood. ? Warmth. ? Pus or a bad smell. Managing pain and swelling If directed, put ice on the affected area. To do this:  Put ice in a plastic bag.  Place a towel between your skin and the bag.  Leave the ice on for 20 minutes, 2-3 times per day.  Activity  Do not do any high-energy activities for as long as told by your health care provider.  Do not lift anything that is heavier than 10 lb (4.5 kg), or the limit that you are told, until your health care provider says that it is safe.  Return to your normal activities as told by your health care provider. Ask your health care provider what activities are safe for you.  Do not drive for 24 hours if you were given a sedative during your procedure.  Ask your health care provider when it is safe to drive. General instructions  Do not use any products that contain nicotine or tobacco, such as cigarettes, e-cigarettes, and chewing tobacco. These can delay incision healing after surgery. If you need help quitting, ask your health care provider.  If you were given a scrotal support, wear it as told by your health care provider.  If you   had a drain put in during the procedure, you will need to have it removed at a follow-up visit.  Keep all follow-up visits as told by your health care provider. This is important. Contact a health care provider if:  Your pain is not controlled with medicine.  You have more redness, swelling, or pain around your scrotum.  You have fluid or blood coming from your incision.  Your incision feels warm to the touch.  You have pus or a bad smell coming from your  scrotum.  You have a fever. Get help right away if:  You develop shaking, chills, and a fever that is higher than 101.58F (38.8C).  You have redness or swelling that starts at your scrotum and spreads outward to cover your whole groin.  You develop swelling of the legs or difficulty breathing. Summary  After a hydrocelectomy, it is common to have mild discomfort, swelling, and bruising.  Do not take baths, swim, or use a hot tub until your health care provider approves. Ask your health care provider if you may take showers.  If directed, put ice on the affected area to help with pain and swelling.  Do not do any high-energy activities or lift anything heavier than 10 lb (4.5 kg) for as long as told by your health care provider.  If you were given a scrotal support, keep it dry. Wear the scrotal support as told by your health care provider. This information is not intended to replace advice given to you by your health care provider. Make sure you discuss any questions you have with your health care provider. Document Revised: 07/15/2018 Document Reviewed: 07/15/2018 Elsevier Patient Education  [place]. Benign Prostatic Hyperplasia  Benign prostatic hyperplasia (BPH) is an enlarged prostate gland that is caused by the normal aging process and not by cancer. The prostate is a walnut-sized gland that is involved in the production of semen. It is located in front of the rectum and below the bladder. The bladder stores urine and the urethra is the tube that carries the urine out of the body. The prostate may get bigger as a man gets older. An enlarged prostate can press on the urethra. This can make it harder to pass urine. The build-up of urine in the bladder can cause infection. Back pressure and infection may progress to bladder damage and kidney (renal) failure. What are the causes? This condition is part of a normal aging process. However, not all men develop problems from  this condition. If the prostate enlarges away from the urethra, urine flow will not be blocked. If it enlarges toward the urethra and compresses it, there will be problems passing urine. What increases the risk? This condition is more likely to develop in men over the age of 64 years. What are the signs or symptoms? Symptoms of this condition include:  Getting up often during the night to urinate.  Needing to urinate frequently during the day.  Difficulty starting urine flow.  Decrease in size and strength of your urine stream.  Leaking (dribbling) after urinating.  Inability to pass urine. This needs immediate treatment.  Inability to completely empty your bladder.  Pain when you pass urine. This is more common if there is also an infection.  Urinary tract infection (UTI). How is this diagnosed? This condition is diagnosed based on your medical history, a physical exam, and your symptoms. Tests will also be done, such as:  A post-void bladder scan. This measures any amount of  urine that may remain in your bladder after you finish urinating.  A digital rectal exam. In a rectal exam, your health care provider checks your prostate by putting a lubricated, gloved finger into your rectum to feel the back of your prostate gland. This exam detects the size of your gland and any abnormal lumps or growths.  An exam of your urine (urinalysis).  A prostate specific antigen (PSA) screening. This is a blood test used to screen for prostate cancer.  An ultrasound. This test uses sound waves to electronically produce a picture of your prostate gland. Your health care provider may refer you to a specialist in kidney and prostate diseases (urologist). How is this treated? Once symptoms begin, your health care provider will monitor your condition (active surveillance or watchful waiting). Treatment for this condition will depend on the severity of your condition. Treatment may  include:  Observation and yearly exams. This may be the only treatment needed if your condition and symptoms are mild.  Medicines to relieve your symptoms, including: ? Medicines to shrink the prostate. ? Medicines to relax the muscle of the prostate.  Surgery in severe cases. Surgery may include: ? Prostatectomy. In this procedure, the prostate tissue is removed completely through an open incision or with a laparoscope or robotics. ? Transurethral resection of the prostate (TURP). In this procedure, a tool is inserted through the opening at the tip of the penis (urethra). It is used to cut away tissue of the inner core of the prostate. The pieces are removed through the same opening of the penis. This removes the blockage. ? Transurethral incision (TUIP). In this procedure, small cuts are made in the prostate. This lessens the prostate's pressure on the urethra. ? Transurethral microwave thermotherapy (TUMT). This procedure uses microwaves to create heat. The heat destroys and removes a small amount of prostate tissue. ? Transurethral needle ablation (TUNA). This procedure uses radio frequencies to destroy and remove a small amount of prostate tissue. ? Interstitial laser coagulation ([place]). This procedure uses a laser to destroy and remove a small amount of prostate tissue. ? Transurethral electrovaporization (TUVP). This procedure uses electrodes to destroy and remove a small amount of prostate tissue. ? Prostatic urethral lift. This procedure inserts an implant to push the lobes of the prostate away from the urethra. Follow these instructions at home:  Take over-the-counter and prescription medicines only as told by your health care provider.  Monitor your symptoms for any changes. Contact your health care provider with any changes.  Avoid drinking large amounts of liquid before going to bed or out in public.  Avoid or reduce how much caffeine or alcohol you drink.  Give yourself time  when you urinate.  Keep all follow-up visits as told by your health care provider. This is important. Contact a health care provider if:  You have unexplained back pain.  Your symptoms do not get better with treatment.  You develop side effects from the medicine you are taking.  Your urine becomes very dark or has a bad smell.  Your lower abdomen becomes distended and you have trouble passing your urine. Get help right away if:  You have a fever or chills.  You suddenly cannot urinate.  You feel lightheaded, or very dizzy, or you faint.  There are large amounts of blood or clots in the urine.  Your urinary problems become hard to manage.  You develop moderate to severe low back or flank pain. The flank is the side  of your body between the ribs and the hip. These symptoms may represent a serious problem that is an emergency. Do not wait to see if the symptoms will go away. Get medical help right away. Call your local emergency services (911 in the [place] Do not drive yourself to the hospital. Summary  Benign prostatic hyperplasia (BPH) is an enlarged prostate that is caused by the normal aging process and not by cancer.  An enlarged prostate can press on the urethra. This can make it hard to pass urine.  This condition is part of a normal aging process and is more likely to develop in men over the age of 62 years.  Get help right away if you suddenly cannot urinate. This information is not intended to replace advice given to you by your health care provider. Make sure you discuss any questions you have with your health care provider. Document Revised: 01/14/2018 Document Reviewed: 03/26/2016 Elsevier Patient Education  2020 [employer].

## 2019-07-03 NOTE — Progress Notes (Signed)
07/03/2019 9:47 AM   Malik Bridges [birth year] [id number]  Referring provider: Kerri Perches, PA-C [place],  [place] [address]  Chief Complaint  Patient presents with  . Results    HPI:  F/u to review bx results and plan hydrocele management.   1) PSA elevation - pt seen for BPH Jul 2020when a PSA was noted to be 5.2. He did have a CT scan of the abdomen and pelvis on 01/09/2018 which revealed an 80 g prostate. There was no lymphadenopathy or bony lesions.  -Mar 2021 PSA 6.7  -Apr 2021 Biopsy, benign, prostate 93 grams   2) BPH-as above prostate about 80 - 90 g on imaging. He complained of intermittent flow and urgency. Occasional weak stream. He is on tamsulosin. AUASS = 15, QOL - unhappy. PVR 0 ml. Some frequency, weak stream.   3) hydroceles-scrotal ultrasound revealed a right inguinal hernia and bilateral right greater than left hydroceles. The hydroceles are heavy and bothersome to the patient. On exam he had a large right hydrocele and smaller left hydrocele. Dr. Dahlia Byes did bilater ing. Hernia repair Feb 2020.   He is well. Biopsy was benign. He is well. He continues tamsulosin, but most bothersome if the scrotal swelling which he has c/o about over the past months.   PMH: Past Medical History:  Diagnosis Date  . Abdominal hernia   . Diabetes mellitus ([place])    Pt taking Insulin  . Enlarged prostate   . Hyperlipidemia   . Hypertension     Surgical History: Past Surgical History:  Procedure Laterality Date  . CIRCUMCISION    . COLONOSCOPY WITH PROPOFOL N/A 05/15/2018   Procedure: COLONOSCOPY WITH PROPOFOL;  Surgeon: Lin Landsman, MD;  Location: [facility] ENDOSCOPY;  Service: Gastroenterology;  Laterality: N/A;  . ROBOT ASSISTED INGUINAL HERNIA REPAIR Bilateral 05/01/2018   Procedure: ROBOT ASSISTED INGUINAL HERNIA REPAIR;  Surgeon: Jules Husbands, MD;  Location: [facility] ORS;  Service: General;  Laterality: Bilateral;  . UMBILICAL HERNIA  REPAIR  05/01/2018   Procedure: HERNIA REPAIR UMBILICAL ADULT;  Surgeon: Jules Husbands, MD;  Location: [facility] ORS;  Service: General;;    Home Medications:  Allergies as of 07/03/2019      Reactions   Hydrochlorothiazide Other (See Comments)   K to 3.0 on Zestoretic 20-25      Medication List       Accurate as of July 03, 2019  9:47 AM. If you have any questions, ask your nurse or doctor.        albuterol 108 (90 Base) MCG/ACT inhaler Commonly known as: VENTOLIN HFA Inhale 2 puffs into the lungs every 4 (four) hours as needed.   aspirin EC 81 MG tablet Take 81 mg by mouth daily.   atorvastatin 20 MG tablet Commonly known as: LIPITOR Take 20 mg by mouth daily.   Deluxe Tablet Cutter Misc Use to cut metformin tablets.   Flovent HFA 110 MCG/ACT inhaler Generic drug: fluticasone Inhale 2 puffs into the lungs 2 (two) times daily.   insulin glargine 100 UNIT/ML injection Commonly known as: LANTUS Inject 35 Units into the skin at bedtime.   lisinopril 20 MG tablet Commonly known as: ZESTRIL Take 20 mg by mouth daily.   metFORMIN 1000 MG tablet Commonly known as: GLUCOPHAGE Take 1,000 mg by mouth 2 (two) times daily with a meal.   naproxen 500 MG tablet Commonly known as: NAPROSYN Take 500 mg by mouth 2 (two) times daily.  nicotine polacrilex 2 MG gum Commonly known as: NICORETTE Take by mouth.   potassium chloride SA 20 MEQ tablet Commonly known as: KLOR-CON   Precision QID Test test strip Generic drug: glucose blood Check twice a day, E11.42   spironolactone 25 MG tablet Commonly known as: ALDACTONE Take 12.5 mg by mouth daily.   tamsulosin 0.4 MG Caps capsule Commonly known as: FLOMAX Take 0.4 mg by mouth daily.   traZODone 50 MG tablet Commonly known as: DESYREL Take 50 mg by mouth at bedtime.   TRUEplus Pen Needles 31G X 6 MM Misc Generic drug: Insulin Pen Needle       Allergies:  Allergies  Allergen Reactions  . Hydrochlorothiazide  Other (See Comments)    K to 3.0 on Zestoretic 20-25    Family History: No family history on file.  Social History:  reports that he has quit smoking. His smoking use included cigarettes. His smokeless tobacco use includes snuff. He reports previous alcohol use. He reports that he does not use drugs.   Physical Exam: BP (!) 167/81 (BP Location: Left Arm, Patient Position: Sitting, Cuff Size: Normal)   Pulse 64   Ht 5\' 2"  (1.575 m)   Wt 162 lb (73.5 kg)   BMI 29.63 kg/m   Constitutional:  Alert and oriented, No acute distress. HEENT: [place] AT, moist mucus membranes.  Trachea midline, no masses. Cardiovascular: No clubbing, cyanosis, or edema. Respiratory: Normal respiratory effort, no increased work of breathing. GI: Abdomen is soft, nontender, nondistended, no abdominal masses GU: No CVA tenderness Lymph: No cervical or inguinal lymphadenopathy. Skin: No rashes, bruises or suspicious lesions. Neurologic: Grossly intact, no focal deficits, moving all 4 extremities. Psychiatric: Normal mood and affect.  Laboratory Data: Lab Results  Component Value Date   WBC 4.2 04/21/2018   HGB 17.3 (H) 05/01/2018   HCT 51.0 05/01/2018   MCV 83.3 04/21/2018   PLT 197 04/21/2018    Lab Results  Component Value Date   CREATININE 0.85 04/21/2018    No results found for: PSA  No results found for: TESTOSTERONE  No results found for: HGBA1C  Urinalysis    Component Value Date/Time   APPEARANCEUR Clear 05/22/2019 0935   GLUCOSEU 1+ (A) 05/22/2019 0935   BILIRUBINUR Negative 05/22/2019 0935   PROTEINUR 2+ (A) 05/22/2019 0935   NITRITE Negative 05/22/2019 0935   LEUKOCYTESUR Negative 05/22/2019 0935    Lab Results  Component Value Date   LABMICR See below: 05/22/2019   WBCUA 0-5 05/22/2019   LABEPIT 0-10 05/22/2019   MUCUS Present (A) 05/22/2019   BACTERIA None seen 05/22/2019    Pertinent Imaging: n/a No results found for this or any previous visit. No results found for  this or any previous visit. No results found for this or any previous visit. No results found for this or any previous visit. No results found for this or any previous visit. No results found for this or any previous visit. No results found for this or any previous visit. No results found for this or any previous visit.  Assessment & Plan:    PSA - benign eval. Likely BPH.  BPH, weak stream  - discussed nature r/b of adding 5ari or considering HoLEP. He will consider.   Hydrocele - discussed the nature r/b/a to right hydrocelectomy and possible left hydrocelectomy. He elects to proceed. Discussed one of my colleagues would be doing the case.   No follow-ups on file.  Festus Aloe, MD  [facility]  [address], [place], [place] [address] [phone]

## 2019-07-03 NOTE — H&P (View-Only) (Signed)
07/03/2019 9:47 AM   Malik Bridges 02-Jul-1950 [id number]  Referring provider: Kerri Perches, PA-C [address],  [place] [address]  Chief Complaint  Patient presents with  . Results    HPI:  F/u to review bx results and plan hydrocele management.   1) PSA elevation - pt seen for BPH Jul 2020when a PSA was noted to be 5.2. He did have a CT scan of the abdomen and pelvis on 01/09/2018 which revealed an 80 g prostate. There was no lymphadenopathy or bony lesions.  -Mar 2021 PSA 6.7  -Apr 2021 Biopsy, benign, prostate 93 grams   2) BPH-as above prostate about 80 - 90 g on imaging. He complained of intermittent flow and urgency. Occasional weak stream. He is on tamsulosin. AUASS = 15, QOL - unhappy. PVR 0 ml. Some frequency, weak stream.   3) hydroceles-scrotal ultrasound revealed a right inguinal hernia and bilateral right greater than left hydroceles. The hydroceles are heavy and bothersome to the patient. On exam he had a large right hydrocele and smaller left hydrocele. Dr. Dahlia Byes did bilater ing. Hernia repair Feb 2020.   He is well. Biopsy was benign. He is well. He continues tamsulosin, but most bothersome if the scrotal swelling which he has c/o about over the past months.   PMH: Past Medical History:  Diagnosis Date  . Abdominal hernia   . Diabetes mellitus ([place])    Pt taking Insulin  . Enlarged prostate   . Hyperlipidemia   . Hypertension     Surgical History: Past Surgical History:  Procedure Laterality Date  . CIRCUMCISION    . COLONOSCOPY WITH PROPOFOL N/A 05/15/2018   Procedure: COLONOSCOPY WITH PROPOFOL;  Surgeon: Lin Landsman, MD;  Location: [facility] ENDOSCOPY;  Service: Gastroenterology;  Laterality: N/A;  . ROBOT ASSISTED INGUINAL HERNIA REPAIR Bilateral 05/01/2018   Procedure: ROBOT ASSISTED INGUINAL HERNIA REPAIR;  Surgeon: Jules Husbands, MD;  Location: [facility] ORS;  Service: General;  Laterality: Bilateral;  . UMBILICAL HERNIA  REPAIR  05/01/2018   Procedure: HERNIA REPAIR UMBILICAL ADULT;  Surgeon: Jules Husbands, MD;  Location: [facility] ORS;  Service: General;;    Home Medications:  Allergies as of 07/03/2019      Reactions   Hydrochlorothiazide Other (See Comments)   K to 3.0 on Zestoretic 20-25      Medication List       Accurate as of July 03, 2019  9:47 AM. If you have any questions, ask your nurse or doctor.        albuterol 108 (90 Base) MCG/ACT inhaler Commonly known as: VENTOLIN HFA Inhale 2 puffs into the lungs every 4 (four) hours as needed.   aspirin EC 81 MG tablet Take 81 mg by mouth daily.   atorvastatin 20 MG tablet Commonly known as: LIPITOR Take 20 mg by mouth daily.   Deluxe Tablet Cutter Misc Use to cut metformin tablets.   Flovent HFA 110 MCG/ACT inhaler Generic drug: fluticasone Inhale 2 puffs into the lungs 2 (two) times daily.   insulin glargine 100 UNIT/ML injection Commonly known as: LANTUS Inject 35 Units into the skin at bedtime.   lisinopril 20 MG tablet Commonly known as: ZESTRIL Take 20 mg by mouth daily.   metFORMIN 1000 MG tablet Commonly known as: GLUCOPHAGE Take 1,000 mg by mouth 2 (two) times daily with a meal.   naproxen 500 MG tablet Commonly known as: NAPROSYN Take 500 mg by mouth 2 (two) times daily.  nicotine polacrilex 2 MG gum Commonly known as: NICORETTE Take by mouth.   potassium chloride SA 20 MEQ tablet Commonly known as: KLOR-CON   Precision QID Test test strip Generic drug: glucose blood Check twice a day, E11.42   spironolactone 25 MG tablet Commonly known as: ALDACTONE Take 12.5 mg by mouth daily.   tamsulosin 0.4 MG Caps capsule Commonly known as: FLOMAX Take 0.4 mg by mouth daily.   traZODone 50 MG tablet Commonly known as: DESYREL Take 50 mg by mouth at bedtime.   TRUEplus Pen Needles 31G X 6 MM Misc Generic drug: Insulin Pen Needle       Allergies:  Allergies  Allergen Reactions  . Hydrochlorothiazide  Other (See Comments)    K to 3.0 on Zestoretic 20-25    Family History: No family history on file.  Social History:  reports that he has quit smoking. His smoking use included cigarettes. His smokeless tobacco use includes snuff. He reports previous alcohol use. He reports that he does not use drugs.   Physical Exam: BP (!) 167/81 (BP Location: Left Arm, Patient Position: Sitting, Cuff Size: Normal)   Pulse 64   Ht 5\' 2"  (1.575 m)   Wt 162 lb (73.5 kg)   BMI 29.63 kg/m   Constitutional:  Alert and oriented, No acute distress. HEENT: [place] AT, moist mucus membranes.  Trachea midline, no masses. Cardiovascular: No clubbing, cyanosis, or edema. Respiratory: Normal respiratory effort, no increased work of breathing. GI: Abdomen is soft, nontender, nondistended, no abdominal masses GU: No CVA tenderness Lymph: No cervical or inguinal lymphadenopathy. Skin: No rashes, bruises or suspicious lesions. Neurologic: Grossly intact, no focal deficits, moving all 4 extremities. Psychiatric: Normal mood and affect.  Laboratory Data: Lab Results  Component Value Date   WBC 4.2 04/21/2018   HGB 17.3 (H) 05/01/2018   HCT 51.0 05/01/2018   MCV 83.3 04/21/2018   PLT 197 04/21/2018    Lab Results  Component Value Date   CREATININE 0.85 04/21/2018    No results found for: PSA  No results found for: TESTOSTERONE  No results found for: HGBA1C  Urinalysis    Component Value Date/Time   APPEARANCEUR Clear 05/22/2019 0935   GLUCOSEU 1+ (A) 05/22/2019 0935   BILIRUBINUR Negative 05/22/2019 0935   PROTEINUR 2+ (A) 05/22/2019 0935   NITRITE Negative 05/22/2019 0935   LEUKOCYTESUR Negative 05/22/2019 0935    Lab Results  Component Value Date   LABMICR See below: 05/22/2019   WBCUA 0-5 05/22/2019   LABEPIT 0-10 05/22/2019   MUCUS Present (A) 05/22/2019   BACTERIA None seen 05/22/2019    Pertinent Imaging: n/a No results found for this or any previous visit. No results found for  this or any previous visit. No results found for this or any previous visit. No results found for this or any previous visit. No results found for this or any previous visit. No results found for this or any previous visit. No results found for this or any previous visit. No results found for this or any previous visit.  Assessment & Plan:    PSA - benign eval. Likely BPH.  BPH, weak stream  - discussed nature r/b of adding 5ari or considering HoLEP. He will consider.   Hydrocele - discussed the nature r/b/a to right hydrocelectomy and possible left hydrocelectomy. He elects to proceed. Discussed one of my colleagues would be doing the case.   No follow-ups on file.  Festus Aloe, MD  [facility]  [address], [place], [place] [address] [phone]

## 2019-07-10 ENCOUNTER — Encounter
Admission: RE | Admit: 2019-07-10 | Discharge: 2019-07-10 | Disposition: A | Discharge status: Home / Self Care | Payer: Medicare Other | Source: Ambulatory Visit | Attending: Urology | Admitting: Urology

## 2019-07-10 ENCOUNTER — Other Ambulatory Visit: Payer: Self-pay

## 2019-07-10 DIAGNOSIS — E119 Type 2 diabetes mellitus without complications: Secondary | ICD-10-CM | POA: Insufficient documentation

## 2019-07-10 DIAGNOSIS — Z01818 Encounter for other preprocedural examination: Secondary | ICD-10-CM | POA: Insufficient documentation

## 2019-07-10 DIAGNOSIS — I10 Essential (primary) hypertension: Secondary | ICD-10-CM | POA: Insufficient documentation

## 2019-07-10 HISTORY — DX: Unspecified asthma, uncomplicated: J45.909

## 2019-07-10 NOTE — Patient Instructions (Addendum)
INSTRUCTIONS FOR SURGERY     Your surgery is scheduled for:   Tuesday, MAY 18TH     To find out your arrival time for the day of surgery,          please call [phone] between 1 pm and 3 pm on :  Monday, MAY 17TH     When you arrive for surgery, report to the [facility].       Do NOT stop on the first floor to register.    REMEMBER: Instructions that are not followed completely may result in serious medical risk,  up to and including death, or upon the discretion of your surgeon and anesthesiologist,            your surgery may need to be rescheduled.  __X__ 1. Do not eat food after midnight the night before your procedure.                    No gum, candy, lozenger, tic tacs, tums or hard candies.                  ABSOLUTELY NOTHING SOLID IN YOUR MOUTH AFTER MIDNIGHT                    You may drink unlimited clear liquids up to 2 hours before you are scheduled to arrive for surgery.                   Do not drink anything within those 2 hours unless you need to take medicine, then take the                   smallest amount you need.  Clear liquids include:  water, apple juice without pulp,                   any flavor Gatorade, Black coffee, black tea.  Sugar may be added but no dairy/ honey /lemon.                        Broth and jello is not considered a clear liquid.  __x__  2. On the morning of surgery, please brush your teeth with toothpaste and water. You may rinse with                  mouthwash if you wish but DO NOT SWALLOW TOOTHPASTE OR MOUTHWASH  __X___3. NO alcohol for 24 hours before or after surgery.  __x___ 4.  Do NOT smoke or use e-cigarettes for 24 HOURS PRIOR TO SURGERY.                      DO NOT Use any chewable tobacco products for at least 6 hours prior to surgery.  __x___ 5. If you start any new medication after this appointment and prior to surgery, please  Bring it with you on the day of surgery.  ___x__ 6. Notify your doctor if there is any change in your medical condition, such as fever,  infection, vomitting, diarrhea or any open sores.  __x___ 7.  USE the SURGICAL SCRUB SOAP as instructed, the night before surgery and the day of surgery.                   Once you have washed with this soap, do NOT use any of the following: Powders, perfumes                    or lotions. Please do not wear make up, hairpins, clips or nail polish. You MAY  wear deodorant.                   Men may shave their face and neck.  Women need to shave 48 hours prior to surgery.                   DO NOT wear ANY jewelry on the day of surgery. If there are rings that are too tight to                    remove easily, please address this prior to the surgery day. Piercings need to be removed.                                                                     NO METAL ON YOUR BODY.                    Do NOT bring any valuables.  If you came to Pre-Admit testing then you will not need license,                     insurance card or credit card.  If you will be staying overnight, please either leave your things in                     the car or have your family be responsible for these items.                     [facility] IS NOT RESPONSIBLE FOR BELONGINGS OR VALUABLES.  ___X__ 8. DO NOT wear contact lenses on surgery day.  You may not have dentures,                     Hearing aides, contacts or glasses in the operating room. These items can be                    Placed in the Recovery Room to receive immediately after surgery.  __x___ 9. IF YOU ARE SCHEDULED TO GO HOME ON THE SAME DAY, YOU MUST                   Have someone to drive you home and to stay with you  for the first 24 hours.                    Have an arrangement prior to arriving on surgery day.  ___x__ 10. Take the following medications on the morning of surgery with a sip of water:  1. FLOVENT INHALER                     2. FLOMAX/tamsulosin (if he starts taking it)                     3.                    _____ 11.  Follow any instructions provided to you by your surgeon.                        Such as enema, clear liquid bowel prep  __X__  12. STOP ALL ASPIRIN PRODUCTS ONE WEEK BEFORE SURGERY.                       THIS INCLUDES BC POWDERS / GOODIES POWDER  __x___ 13. STOP Anti-inflammatories as of: ONE WEEK BEFORE SURGERY                      This includes IBUPROFEN / MOTRIN / ADVIL / ALEVE/ NAPROXEN                    YOU MAY TAKE TYLENOL ANY TIME PRIOR TO SURGERY.  __X____16.  Stop Metformin 2 full days prior to surgery.  Stop on: MAY 15TH                     TAKE 1/2 OF USUAL INSULIN DOSE ON THE EVENING PRIOR TO SURGERY.                     Do NOT take any diabetes medications on surgery day.  ___x___17.  Continue to take the following medications but do not take on the morning of surgery:                         LISINOPRIL // ALDACTONE(spironalactone)  ______18.   Wear clean and comfortable clothing to the hospital.  MEDICAL ADVANCE BOOKLET INCLUDED IN BAG OF INFORMATION. PLEASE READ AT   YOUR LEISURE AND COMPLETE QUESTIONS.  HAVE IT NOTARIZED TO MAKE IT LEGAL.

## 2019-07-17 ENCOUNTER — Other Ambulatory Visit: Payer: Self-pay

## 2019-07-17 ENCOUNTER — Encounter
Admission: RE | Admit: 2019-07-17 | Discharge: 2019-07-17 | Disposition: A | Discharge status: Home / Self Care | Payer: Medicare Other | Source: Ambulatory Visit | Attending: Urology | Admitting: Urology

## 2019-07-17 DIAGNOSIS — Z01818 Encounter for other preprocedural examination: Secondary | ICD-10-CM | POA: Insufficient documentation

## 2019-07-17 DIAGNOSIS — I10 Essential (primary) hypertension: Secondary | ICD-10-CM

## 2019-07-17 DIAGNOSIS — Z20822 Contact with and (suspected) exposure to covid-19: Secondary | ICD-10-CM | POA: Diagnosis not present

## 2019-07-17 DIAGNOSIS — E119 Type 2 diabetes mellitus without complications: Secondary | ICD-10-CM | POA: Insufficient documentation

## 2019-07-17 LAB — CBC
HCT: 46.5 % (ref 39.0–52.0)
Hemoglobin: 15.3 g/dL (ref 13.0–17.0)
MCH: 27.8 pg (ref 26.0–34.0)
MCHC: 32.9 g/dL (ref 30.0–36.0)
MCV: 84.4 fL (ref 80.0–100.0)
Platelets: 183 10*3/uL (ref 150–400)
RBC: 5.51 MIL/uL (ref 4.22–5.81)
RDW: 12.5 % (ref 11.5–15.5)
WBC: 4.3 10*3/uL (ref 4.0–10.5)
nRBC: 0 % (ref 0.0–0.2)

## 2019-07-17 LAB — BASIC METABOLIC PANEL
Anion gap: 9 (ref 5–15)
BUN: 13 mg/dL (ref 8–23)
CO2: 29 mmol/L (ref 22–32)
Calcium: 8.7 mg/dL — ABNORMAL LOW (ref 8.9–10.3)
Chloride: 99 mmol/L (ref 98–111)
Creatinine, Ser: 0.75 mg/dL (ref 0.61–1.24)
GFR calc Af Amer: >60 mL/min (ref >60)
GFR calc non Af Amer: >60 mL/min (ref >60)
Glucose, Bld: 244 mg/dL — ABNORMAL HIGH (ref 70–99)
Potassium: 3.2 mmol/L — ABNORMAL LOW (ref 3.5–5.1)
Sodium: 137 mmol/L (ref 135–145)

## 2019-07-17 LAB — SARS CORONAVIRUS 2 (TAT 6-24 HRS): SARS Coronavirus 2: NEGATIVE

## 2019-07-21 ENCOUNTER — Telehealth: Payer: Self-pay

## 2019-07-21 ENCOUNTER — Ambulatory Visit
Admission: RE | Admit: 2019-07-21 | Discharge: 2019-07-21 | Disposition: A | Discharge status: Home / Self Care | Payer: Medicare Other | Attending: Urology | Admitting: Urology

## 2019-07-21 ENCOUNTER — Encounter: Payer: Self-pay | Admitting: Urology

## 2019-07-21 ENCOUNTER — Encounter
Admission: RE | Disposition: A | Discharge status: Home / Self Care | Payer: Self-pay | Source: Home / Self Care | Attending: Urology

## 2019-07-21 ENCOUNTER — Other Ambulatory Visit: Payer: Self-pay

## 2019-07-21 ENCOUNTER — Telehealth: Payer: Self-pay | Admitting: Radiology

## 2019-07-21 ENCOUNTER — Ambulatory Visit: Payer: Medicare Other | Admitting: Anesthesiology

## 2019-07-21 DIAGNOSIS — E119 Type 2 diabetes mellitus without complications: Secondary | ICD-10-CM | POA: Diagnosis not present

## 2019-07-21 DIAGNOSIS — N430 Encysted hydrocele: Secondary | ICD-10-CM

## 2019-07-21 DIAGNOSIS — I10 Essential (primary) hypertension: Secondary | ICD-10-CM | POA: Diagnosis not present

## 2019-07-21 DIAGNOSIS — Z7982 Long term (current) use of aspirin: Secondary | ICD-10-CM | POA: Diagnosis not present

## 2019-07-21 DIAGNOSIS — E785 Hyperlipidemia, unspecified: Secondary | ICD-10-CM | POA: Insufficient documentation

## 2019-07-21 DIAGNOSIS — N401 Enlarged prostate with lower urinary tract symptoms: Secondary | ICD-10-CM | POA: Diagnosis not present

## 2019-07-21 DIAGNOSIS — N4341 Spermatocele of epididymis, single: Secondary | ICD-10-CM | POA: Insufficient documentation

## 2019-07-21 DIAGNOSIS — N433 Hydrocele, unspecified: Secondary | ICD-10-CM | POA: Diagnosis not present

## 2019-07-21 DIAGNOSIS — Z888 Allergy status to other drugs, medicaments and biological substances status: Secondary | ICD-10-CM | POA: Insufficient documentation

## 2019-07-21 DIAGNOSIS — Z87891 Personal history of nicotine dependence: Secondary | ICD-10-CM | POA: Insufficient documentation

## 2019-07-21 DIAGNOSIS — N4340 Spermatocele of epididymis, unspecified: Secondary | ICD-10-CM

## 2019-07-21 DIAGNOSIS — Z79899 Other long term (current) drug therapy: Secondary | ICD-10-CM | POA: Diagnosis not present

## 2019-07-21 HISTORY — PX: HYDROCELE EXCISION: SHX482

## 2019-07-21 LAB — POCT I-STAT, CHEM 8
BUN: 8 mg/dL (ref 8–23)
Calcium, Ion: 1.12 mmol/L — ABNORMAL LOW (ref 1.15–1.40)
Chloride: 97 mmol/L — ABNORMAL LOW (ref 98–111)
Creatinine, Ser: 0.7 mg/dL (ref 0.61–1.24)
Glucose, Bld: 237 mg/dL — ABNORMAL HIGH (ref 70–99)
HCT: 48 % (ref 39.0–52.0)
Hemoglobin: 16.3 g/dL (ref 13.0–17.0)
Potassium: 3.6 mmol/L (ref 3.5–5.1)
Sodium: 138 mmol/L (ref 135–145)
TCO2: 29 mmol/L (ref 22–32)

## 2019-07-21 LAB — GLUCOSE, CAPILLARY: Glucose-Capillary: 211 mg/dL — ABNORMAL HIGH (ref 70–99)

## 2019-07-21 SURGERY — HYDROCELECTOMY
Anesthesia: General | Site: Scrotum | Laterality: Right

## 2019-07-21 MED ORDER — ONDANSETRON HCL 4 MG/2ML IJ SOLN
INTRAMUSCULAR | Status: AC
  Filled 2019-07-21: qty 2

## 2019-07-21 MED ORDER — OXYCODONE HCL 5 MG/5ML PO SOLN: 5.0000 mg | Freq: Once | ORAL | Status: DC | PRN

## 2019-07-21 MED ORDER — LIDOCAINE HCL (CARDIAC) PF 100 MG/5ML IV SOSY
PREFILLED_SYRINGE | INTRAVENOUS | Status: DC | PRN
  Administered 2019-07-21: 80 mg via INTRAVENOUS

## 2019-07-21 MED ORDER — FENTANYL CITRATE (PF) 100 MCG/2ML IJ SOLN
INTRAMUSCULAR | Status: DC | PRN
  Administered 2019-07-21 (×2): 25 ug via INTRAVENOUS

## 2019-07-21 MED ORDER — ONDANSETRON HCL 4 MG/2ML IJ SOLN: 4.0000 mg | Freq: Once | INTRAMUSCULAR | Status: DC | PRN

## 2019-07-21 MED ORDER — CEFAZOLIN SODIUM-DEXTROSE 2-4 GM/100ML-% IV SOLN
2.0000 g | INTRAVENOUS | Status: AC
  Administered 2019-07-21: 2 g via INTRAVENOUS

## 2019-07-21 MED ORDER — SULFAMETHOXAZOLE-TRIMETHOPRIM 800-160 MG PO TABS
1.0000 | ORAL_TABLET | Freq: Two times a day (BID) | ORAL | 0 refills | Status: DC
Start: 2019-07-21 — End: 2019-07-23

## 2019-07-21 MED ORDER — CEFAZOLIN SODIUM-DEXTROSE 2-4 GM/100ML-% IV SOLN
INTRAVENOUS | Status: AC
  Filled 2019-07-21: qty 100

## 2019-07-21 MED ORDER — ACETAMINOPHEN 10 MG/ML IV SOLN
INTRAVENOUS | Status: AC
  Filled 2019-07-21: qty 100

## 2019-07-21 MED ORDER — LACTATED RINGERS IV SOLN: INTRAVENOUS | Status: DC | PRN

## 2019-07-21 MED ORDER — ONDANSETRON HCL 4 MG/2ML IJ SOLN
INTRAMUSCULAR | Status: DC | PRN
  Administered 2019-07-21: 4 mg via INTRAVENOUS

## 2019-07-21 MED ORDER — DOUBLE ANTIBIOTIC 500-10000 UNIT/GM EX OINT
TOPICAL_OINTMENT | CUTANEOUS | Status: DC | PRN
  Administered 2019-07-21: 1 via TOPICAL

## 2019-07-21 MED ORDER — ROCURONIUM BROMIDE 10 MG/ML (PF) SYRINGE
PREFILLED_SYRINGE | INTRAVENOUS | Status: AC
  Filled 2019-07-21: qty 20

## 2019-07-21 MED ORDER — HYDROCODONE-ACETAMINOPHEN 5-325 MG PO TABS: 1.0000 | ORAL_TABLET | Freq: Four times a day (QID) | ORAL | 0 refills | Status: DC | PRN

## 2019-07-21 MED ORDER — MIDAZOLAM HCL 2 MG/2ML IJ SOLN
INTRAMUSCULAR | Status: DC | PRN
  Administered 2019-07-21 (×2): 1 mg via INTRAVENOUS

## 2019-07-21 MED ORDER — KETOROLAC TROMETHAMINE 30 MG/ML IJ SOLN
INTRAMUSCULAR | Status: AC
  Filled 2019-07-21: qty 1

## 2019-07-21 MED ORDER — BUPIVACAINE HCL 0.5 % IJ SOLN
INTRAMUSCULAR | Status: DC | PRN
  Administered 2019-07-21: 5 mL

## 2019-07-21 MED ORDER — PHENYLEPHRINE HCL (PRESSORS) 10 MG/ML IV SOLN
INTRAVENOUS | Status: AC
  Filled 2019-07-21: qty 1

## 2019-07-21 MED ORDER — DEXAMETHASONE SODIUM PHOSPHATE 10 MG/ML IJ SOLN
INTRAMUSCULAR | Status: AC
  Filled 2019-07-21: qty 1

## 2019-07-21 MED ORDER — BACITRACIN ZINC 500 UNIT/GM EX OINT
TOPICAL_OINTMENT | CUTANEOUS | Status: AC
  Filled 2019-07-21: qty 56.7

## 2019-07-21 MED ORDER — BUPIVACAINE HCL (PF) 0.5 % IJ SOLN
INTRAMUSCULAR | Status: AC
  Filled 2019-07-21: qty 30

## 2019-07-21 MED ORDER — PROPOFOL 500 MG/50ML IV EMUL
INTRAVENOUS | Status: AC
  Filled 2019-07-21: qty 50

## 2019-07-21 MED ORDER — MIDAZOLAM HCL 2 MG/2ML IJ SOLN
INTRAMUSCULAR | Status: AC
  Filled 2019-07-21: qty 2

## 2019-07-21 MED ORDER — FENTANYL CITRATE (PF) 100 MCG/2ML IJ SOLN
INTRAMUSCULAR | Status: AC
  Filled 2019-07-21: qty 2

## 2019-07-21 MED ORDER — PROPOFOL 10 MG/ML IV BOLUS
INTRAVENOUS | Status: DC | PRN
  Administered 2019-07-21: 150 mg via INTRAVENOUS

## 2019-07-21 MED ORDER — FAMOTIDINE 20 MG PO TABS
ORAL_TABLET | ORAL | Status: AC
  Administered 2019-07-21: 20 mg via ORAL
  Filled 2019-07-21: qty 1

## 2019-07-21 MED ORDER — SUCCINYLCHOLINE CHLORIDE 200 MG/10ML IV SOSY
PREFILLED_SYRINGE | INTRAVENOUS | Status: AC
  Filled 2019-07-21: qty 20

## 2019-07-21 MED ORDER — OXYCODONE HCL 5 MG PO TABS: 5.0000 mg | ORAL_TABLET | Freq: Once | ORAL | Status: DC | PRN

## 2019-07-21 MED ORDER — FENTANYL CITRATE (PF) 100 MCG/2ML IJ SOLN
INTRAMUSCULAR | Status: AC
  Administered 2019-07-21: 25 ug via INTRAVENOUS
  Filled 2019-07-21: qty 2

## 2019-07-21 MED ORDER — ACETAMINOPHEN 10 MG/ML IV SOLN
INTRAVENOUS | Status: DC | PRN
  Administered 2019-07-21: 1000 mg via INTRAVENOUS

## 2019-07-21 MED ORDER — PHENYLEPHRINE HCL (PRESSORS) 10 MG/ML IV SOLN
INTRAVENOUS | Status: DC | PRN
  Administered 2019-07-21 (×3): 100 ug via INTRAVENOUS

## 2019-07-21 MED ORDER — FAMOTIDINE 20 MG PO TABS: 20.0000 mg | ORAL_TABLET | Freq: Once | ORAL | Status: AC

## 2019-07-21 MED ORDER — ACETAMINOPHEN 10 MG/ML IV SOLN: 1000.0000 mg | Freq: Once | INTRAVENOUS | Status: DC | PRN

## 2019-07-21 MED ORDER — LIDOCAINE HCL (PF) 2 % IJ SOLN
INTRAMUSCULAR | Status: AC
  Filled 2019-07-21: qty 5

## 2019-07-21 MED ORDER — FENTANYL CITRATE (PF) 100 MCG/2ML IJ SOLN
25.0000 ug | INTRAMUSCULAR | Status: DC | PRN
  Administered 2019-07-21 (×3): 25 ug via INTRAVENOUS

## 2019-07-21 MED ORDER — SODIUM CHLORIDE 0.9 % IV SOLN: INTRAVENOUS | Status: DC

## 2019-07-21 MED ORDER — EPHEDRINE 5 MG/ML INJ
INTRAVENOUS | Status: AC
  Filled 2019-07-21: qty 10

## 2019-07-21 MED ORDER — BUPIVACAINE HCL (PF) 0.25 % IJ SOLN
INTRAMUSCULAR | Status: AC
  Filled 2019-07-21: qty 30

## 2019-07-21 SURGICAL SUPPLY — 34 items
BLADE CLIPPER SURG (BLADE) ×3 IMPLANT
BLADE SURG 15 STRL LF DISP TIS (BLADE) ×1 IMPLANT
BLADE SURG 15 STRL SS (BLADE) ×2
CANISTER SUCT 1200ML W/VALVE (MISCELLANEOUS) ×3 IMPLANT
CHLORAPREP W/TINT 26 (MISCELLANEOUS) ×3 IMPLANT
COVER WAND RF STERILE (DRAPES) ×3 IMPLANT
DRAIN PENROSE 1/4X12 LTX STRL (WOUND CARE) ×3 IMPLANT
DRAPE LAPAROTOMY 77X122 PED (DRAPES) ×3 IMPLANT
DRSG GAUZE FLUFF 36X18 (GAUZE/BANDAGES/DRESSINGS) ×3 IMPLANT
ELECT CAUTERY BLADE 6.4 (BLADE) ×1 IMPLANT
ELECT REM PT RETURN 9FT ADLT (ELECTROSURGICAL) ×3
ELECTRODE REM PT RTRN 9FT ADLT (ELECTROSURGICAL) ×1 IMPLANT
GAUZE SPONGE 4X4 12PLY STRL (GAUZE/BANDAGES/DRESSINGS) IMPLANT
GLOVE BIOGEL PI IND STRL 7.5 (GLOVE) ×1 IMPLANT
GLOVE BIOGEL PI INDICATOR 7.5 (GLOVE) ×2
GOWN STRL REUS W/ TWL LRG LVL3 (GOWN DISPOSABLE) ×1 IMPLANT
GOWN STRL REUS W/TWL LRG LVL3 (GOWN DISPOSABLE) ×2
GOWN STRL REUS W/TWL XL LVL4 (GOWN DISPOSABLE) ×3 IMPLANT
KIT TURNOVER KIT A (KITS) ×3 IMPLANT
LABEL OR SOLS (LABEL) ×3 IMPLANT
NDL HYPO 25X1 1.5 SAFETY (NEEDLE) ×1 IMPLANT
NEEDLE HYPO 25X1 1.5 SAFETY (NEEDLE) ×3 IMPLANT
NS IRRIG 500ML POUR BTL (IV SOLUTION) ×3 IMPLANT
PACK BASIN MINOR (MISCELLANEOUS) ×3 IMPLANT
SUPPORETR ATHLETIC LG (MISCELLANEOUS) ×1 IMPLANT
SUPPORTER ATHLETIC LG (MISCELLANEOUS) ×3
SUT CHROMIC 3 0 PS 2 (SUTURE) ×2 IMPLANT
SUT CHROMIC 3 0 SH 27 (SUTURE) ×4 IMPLANT
SUT ETHILON 3-0 FS-10 30 BLK (SUTURE) ×3
SUT ETHILON NAB PS2 4-0 18IN (SUTURE) ×1 IMPLANT
SUT MNCRL 3-0 UNDYED SH (SUTURE) ×1 IMPLANT
SUT MONOCRYL 3-0 UNDYED (SUTURE) ×2
SUTURE EHLN 3-0 FS-10 30 BLK (SUTURE) ×1 IMPLANT
SYR 10ML LL (SYRINGE) ×3 IMPLANT

## 2019-07-21 NOTE — Telephone Encounter (Signed)
Incoming call from pt's Malik Bridges, they state that we prescribed Bactrim DS which has the potential to interact with pt's lisinopril and increase his potassium. Informed pharmacy that most recent potassium from today was normal at 3.5. They agree that short course of 3 days should not be harmful to patient, they do however recommend that we recheck potassium at pt's upcoming appointment. Lab ordered. They will make patient aware of signs and symptoms that potassium is increased.

## 2019-07-21 NOTE — Interval H&P Note (Signed)
History and Physical Interval Note: I discussed the procedure in detail with Malik Bridges.  He has a relatively small left hydrocele and we will plan on right hydrocelectomy only.  The need for postoperative drain was discussed. CV: RRR Lungs: Clear  07/21/2019 7:17 AM  Malik Bridges  has presented today for surgery, with the diagnosis of right greater than left hydroceles.  The various methods of treatment have been discussed with the patient and family. After consideration of risks, benefits and other options for treatment, the patient has consented to  Procedure(s): HYDROCELECTOMY ADULT - right and possibly left side (Right) as a surgical intervention.  The patient's history has been reviewed, patient examined, no change in status, stable for surgery.  I have reviewed the patient's chart and labs.  Questions were answered to the patient's satisfaction.     [place]

## 2019-07-21 NOTE — Anesthesia Preprocedure Evaluation (Signed)
Anesthesia Evaluation  Patient identified by MRN, date of birth, ID band Patient awake    Reviewed: Allergy & Precautions, H&P , NPO status , Patient's Chart, lab work & pertinent test results  History of Anesthesia Complications Negative for: history of anesthetic complications  Airway Mallampati: III  TM Distance: >3 FB     Dental  (+) Poor Dentition, Chipped, Missing, Loose, Dental Advisory Given,  Very poor dentition with multiple loose teeth:   Pulmonary asthma , former smoker,    breath sounds clear to auscultation       Cardiovascular hypertension,  Rhythm:Regular Rate:Normal     Neuro/Psych negative neurological ROS  negative psych ROS   GI/Hepatic negative GI ROS, Neg liver ROS,   Endo/Other  diabetes, Insulin Dependent  Renal/GU      Musculoskeletal   Abdominal   Peds  Hematology negative hematology ROS (+)   Anesthesia Other Findings Past Medical History: No date: Abdominal hernia No date: Diabetes mellitus ([place])     Comment:  Pt taking Insulin No date: Enlarged prostate No date: Hyperlipidemia No date: Hypertension  Past Surgical History: No date: CIRCUMCISION  BMI    Body Mass Index:  29.60 kg/m      Reproductive/Obstetrics negative OB ROS                             Anesthesia Physical  Anesthesia Plan  ASA: II  Anesthesia Plan: General   Post-op Pain Management:    Induction: Intravenous  PONV Risk Score and Plan: 3 and Ondansetron, Dexamethasone and Treatment may vary due to age or medical condition  Airway Management Planned: LMA  Additional Equipment: None  Intra-op Plan:   Post-operative Plan: Extubation in OR  Informed Consent: I have reviewed the patients History and Physical, chart, labs and discussed the procedure including the risks, benefits and alternatives for the proposed anesthesia with the patient or authorized representative who  has indicated his/her understanding and acceptance.     Dental Advisory Given  Plan Discussed with: Anesthesiologist and CRNA  Anesthesia Plan Comments: (Discussed risks of anesthesia with patient, including PONV, sore throat, lip/dental damage. Rare risks discussed as well, such as cardiorespiratory and neurological sequelae. Patient understands. Very poor dentition with some loose teeth. Extensively advised patient about dental risks.)        Anesthesia Quick Evaluation

## 2019-07-21 NOTE — Telephone Encounter (Signed)
Appointments made & given to [facility] in Same Day Surgery.

## 2019-07-21 NOTE — Telephone Encounter (Signed)
-----   Message from Abbie Sons, MD sent at 07/21/2019  9:02 AM EDT ----- Regarding: Postop appointments Please schedule PA appointment on 5/20 for drain removal and postop PA appointment 6 weeks

## 2019-07-21 NOTE — Discharge Instructions (Signed)
Discharge instructions following scrotal surgery  Call your doctor for:  Fever is greater than 100.5  Severe nausea or vomiting  Increasing pain not controlled by pain medication  Increasing redness or drainage from incisions  The number for questions or concerns is [phone]  Activity level: No lifting greater than 10 pounds (about equal to gallon of milk) for the next 2 weeks or until cleared to do so at follow-up appointment.  Otherwise activity as tolerated by comfort level.  Diet: May resume your regular diet as tolerated  Medications: You may resume your regular medications.  A prescription for pain medication and antibiotic was sent to [facility]  Driving: No driving while still taking opiate pain medications (weight at least 6-8 hours after last dose).  No driving if you still sore from surgery as it may limit her ability to react quickly if necessary.   Shower/bath: May shower 48 hours after surgery.  No tub bath, hot tub or swimming for 10 days.  Wound care: He may cover wounds with sterile gauze as needed to prevent incisions rubbing on close follow-up in any seepage.  Where tight fitting underpants for at least 2 weeks.  He should apply cold compresses (ice or sac of frozen peas/corn) to your scrotum for at least 48 hours to reduce the swelling.  You should expect that his scrotum will swell up initially and then get smaller over the next 2-4 weeks.  You have a drain in place in a change gauze as needed.  You will have an appointment on 5/20 to have the drain removed  Follow-up appointments: Follow-up appointment will be scheduled with Dr. Bernardo Heater in 6 weeks for a wound check.    AMBULATORY SURGERY  DISCHARGE INSTRUCTIONS   1) The drugs that you were given will stay in your system until tomorrow so for the next 24 hours you should not:  A) Drive an automobile B) Make any legal decisions C) Drink any alcoholic beverage   2) You may resume  regular meals tomorrow.  Today it is better to start with liquids and gradually work up to solid foods.  You may eat anything you prefer, but it is better to start with liquids, then soup and crackers, and gradually work up to solid foods.   3) Please notify your doctor immediately if you have any unusual bleeding, trouble breathing, redness and pain at the surgery site, drainage, fever, or pain not relieved by medication.    4) Additional Instructions:        Please contact your physician with any problems or Same Day Surgery at [phone], Monday through Friday 6 am to 4 pm, or [facility] number at [phone].

## 2019-07-21 NOTE — Anesthesia Procedure Notes (Signed)
Procedure Name: LMA Insertion Date/Time: 07/21/2019 7:45 AM Performed by: Justus Memory, CRNA Pre-anesthesia Checklist: Patient identified, Patient being monitored, Timeout performed, Emergency Drugs available and Suction available Patient Re-evaluated:Patient Re-evaluated prior to induction Oxygen Delivery Method: Circle system utilized Preoxygenation: Pre-oxygenation with 100% oxygen Induction Type: IV induction Ventilation: Mask ventilation without difficulty LMA: LMA inserted LMA Size: 4.0 Tube type: Oral Number of attempts: 1 Placement Confirmation: positive ETCO2 and breath sounds checked- equal and bilateral Tube secured with: Tape Dental Injury: Teeth and Oropharynx as per pre-operative assessment  Comments: Poor dentition, LMA was carefully inserted, air Q 3.5, inadequate TV, replaced with standard size 4 LMA, teeth remain in place as before

## 2019-07-21 NOTE — Anesthesia Postprocedure Evaluation (Signed)
Anesthesia Post Note  Patient: Malik Bridges  Procedure(s) Performed: HYDROCELECTOMY ADULT (Right Scrotum)  Patient location during evaluation: PACU Anesthesia Type: General Level of consciousness: awake and alert Pain management: pain level controlled Vital Signs Assessment: post-procedure vital signs reviewed and stable Respiratory status: spontaneous breathing, nonlabored ventilation, respiratory function stable and patient connected to nasal cannula oxygen Cardiovascular status: blood pressure returned to baseline and stable Postop Assessment: no apparent nausea or vomiting Anesthetic complications: no     Last Vitals:  Vitals:   07/21/19 0915 07/21/19 0920  BP:  (!) 165/90  Pulse: 71 73  Resp: 12 13  Temp:    SpO2: 91% 93%    Last Pain:  Vitals:   07/21/19 0910  PainSc: 0-No pain                 Arita Miss

## 2019-07-21 NOTE — Op Note (Signed)
Preoperative diagnosis:  1. Right hydrocele  Postoperative diagnosis:  1. Right hydrocele 2. Right spermatocele  Procedure: 1. Right hydrocelectomy 2. Right spermatocelectomy  Surgeon: Abbie Sons, MD  Anesthesia: General  Complications: None  Intraoperative findings:  -Moderate right hydrocele.  Testis normal in appearance.  225 cc clear fluid obtained -1 cm right spermatocele with cloudy fluid  EBL: Minimal  Specimens: None  Indication: Malik Bridges is a 69 y.o. patient with bilateral hydroceles right > left.  He was scheduled for right possible bilateral hydrocelectomy.  He states his right hydrocele is bothersome but left is asymptomatic.  Due to the small size and lack of symptoms I did not recommend doing the left side.  After reviewing the management options for treatment, he elected to proceed with the above surgical procedure(s). We have discussed the potential benefits and risks of the procedure, side effects of the proposed treatment, the likelihood of the patient achieving the goals of the procedure, and any potential problems that might occur during the procedure or recuperation. Informed consent has been obtained.  Description of procedure:  The patient was taken to the operating room and general anesthesia was induced.  The patient was placed in the supine position, prepped and draped in the usual sterile fashion, and preoperative antibiotics were administered. A preoperative time-out was performed.   A transverse incision was made in the midportion of the right hemiscrotum.  Dartos fascia was incised with cautery to expose the hydrocele sac.  The hydrocele sac was then bluntly separated from scrotal walls with a combination of blunt dissection and cautery and delivered into the operative field.  The sac was then incised anteriorly and 225 mL of clear fluid was suctioned.  The hydrocele sac was then opened anteriorly with cautery.  The testis was normal in  appearance.  A spermatocele with cloudy fluid at the head of the epididymis was noted.  There was no evidence of a communicating hydrocele. The hydrocele sac was excised with cautery leaving a rim that was everted posteriorly and the edges sutured with 3-0 chromic suture.  1/4 inch Penrose drain was placed through the dependent portion of the right hemiscrotum via a separate stab incision and secured with 4-0 nylon.  The testis was delivered back into the scrotum in its anatomic position.  The incision was anesthetized with 0.25% plain Sensorcaine.  The dartos was closed with a running suture of 3-0 chromic and the skin was closed with a running horizontal mattress suture of 3-0 chromic.  A dressing of fluffs and scrotal support was applied.  The patient was transported to the PACU in stable condition after anesthetic reversal.   Abbie Sons, M.D.

## 2019-07-21 NOTE — Anesthesia Procedure Notes (Signed)
Procedure Name: LMA Insertion Performed by: Arita Miss, MD Pre-anesthesia Checklist: Patient identified, Patient being monitored, Timeout performed, Emergency Drugs available and Suction available Patient Re-evaluated:Patient Re-evaluated prior to induction Oxygen Delivery Method: Circle system utilized Preoxygenation: Pre-oxygenation with 100% oxygen Induction Type: IV induction LMA: LMA inserted LMA Size: 4.0 Number of attempts: 2 Placement Confirmation: positive ETCO2 and breath sounds checked- equal and bilateral Tube secured with: Tape Dental Injury: Teeth and Oropharynx as per pre-operative assessment  Difficulty Due To: Difficult Airway- due to dentition and Difficulty was anticipated Comments: Initial attempt with LMA 3.5 (atraumatic) but poor seal. Replaced with LMA4 with better seal. Atraumatic, dentition still intact as before airway manipulation.

## 2019-07-21 NOTE — Transfer of Care (Signed)
Immediate Anesthesia Transfer of Care Note  Patient: ZAYLIN DYESS  Procedure(s) Performed: HYDROCELECTOMY ADULT (Right Scrotum)  Patient Location: PACU  Anesthesia Type:General  Level of Consciousness: sedated  Airway & Oxygen Therapy: Patient Spontanous Breathing and Patient connected to face mask oxygen  Post-op Assessment: Report given to RN and Post -op Vital signs reviewed and stable  Post vital signs: Reviewed and stable  Last Vitals:  Vitals Value Taken Time  BP 181/86 07/21/19 0847  Temp    Pulse 71 07/21/19 0850  Resp 15 07/21/19 0850  SpO2 95 % 07/21/19 0850  Vitals shown include unvalidated device data.  Last Pain: There were no vitals filed for this visit.       Complications: No apparent anesthesia complications

## 2019-07-23 ENCOUNTER — Ambulatory Visit (INDEPENDENT_AMBULATORY_CARE_PROVIDER_SITE_OTHER): Payer: Medicare Other | Admitting: Physician Assistant

## 2019-07-23 ENCOUNTER — Encounter: Payer: Self-pay | Admitting: Physician Assistant

## 2019-07-23 ENCOUNTER — Other Ambulatory Visit: Payer: Self-pay

## 2019-07-23 ENCOUNTER — Ambulatory Visit: Payer: Medicare Other | Admitting: Physician Assistant

## 2019-07-23 VITALS — BP 123/70 | HR 73

## 2019-07-23 DIAGNOSIS — N433 Hydrocele, unspecified: Secondary | ICD-10-CM

## 2019-07-23 NOTE — Patient Instructions (Signed)
Scrotal Swelling Scrotal swelling refers to a condition in which the sac of skin that contains the testes (scrotum) is enlarged or swollen. Many things can cause the scrotum to enlarge or swell, including:  Fluid around the testicle (hydrocele).  A weakened area in the muscles around the groin (hernia).  An enlarged vein around the testicle (varicocele).  An injury.  An infection.  Certain medical treatments.  Certain medical conditions, such as congestive heart failure.  A recent genital surgery or procedure.  A twisting of the spermatic cord that cuts off blood supply (testicular torsion).  Testicular cancer. Scrotal swelling can happen along with scrotal pain. Follow these instructions at home:  Until the swelling goes away: ? Rest. The best position to rest in is to lie down. ? Limit activity.  Put ice on the scrotum: ? Put ice in a plastic bag. ? Place a towel between your skin and the bag. ? Leave the ice on for 20 minutes, 2-3 times a day for 1-2 days.  Place a rolled towel under your testicles for support.  Wear loose-fitting clothing or an athletic support cup for comfort.  Take over-the-counter and prescription medicines only as told by your health care provider.  Perform a monthly self-exam of the scrotum and penis. Feel for changes. Ask your health care provider how to perform a monthly self-exam if you are unsure. Contact a health care provider if:  You have a sudden pain that is persistent and does not improve.  You have a heavy feeling or notice fluid in the scrotum.  You have pain or burning while urinating.  You have blood in your urine or semen.  You feel a lump around the testicle.  You notice that one testicle is larger than the other. Keep in mind that a small difference in size is normal.  You have a persistent dull ache or pain in your groin or scrotum. Get help right away if:  The pain does not go away.  The pain becomes  severe.  You have a fever or chills.  You have pain or vomiting that cannot be controlled.  One or both sides of the scrotum are very red and swollen.  There is redness spreading upward from your scrotum to your abdomen or downward from your scrotum to your thighs. Summary  Scrotal swelling refers to a condition in which the sac of skin that contains the testes (scrotum) is enlarged.  Many things can cause the scrotum to swell, including hydrocele, a hernia, and a varicocele.  Limiting activity and icing the scrotum may help reduce swelling and pain.  Contact your health care provider if you develop scrotal pain that is sudden and persistent, or if you have pain while urinating. Do this also if you feel a lump around the testicle or notice blood in your urine or semen.  Get help right away for uncontrolled pain or vomiting, for very red and swollen scrotum, or for fever or chills. This information is not intended to replace advice given to you by your health care provider. Make sure you discuss any questions you have with your health care provider. Document Revised: 02/01/2017 Document Reviewed: 05/07/2016 Elsevier Patient Education  2020 [employer]  

## 2019-07-23 NOTE — Progress Notes (Signed)
Patient presented to clinic today for Penrose drain removal s/p right hydrocelectomy and right spermatocelectomy with Dr. Bernardo Heater 2 days ago.  Patient reports postoperative scrotal discomfort and bloody drainage from his incision site and drain since surgery.  Penrose drain noted to been prematurely removed; it remained attached to the skin with a securing stitch.  Using sterile scissors and forceps, I snipped the securing stitch and remove the drain in its entirety.  I subsequently cleaned the drain site with Betadine and covered the drain site with a 4 x 4 of sterile gauze and secured in place with a large Tegaderm.  Counseled patient to keep bandage in place until it falls off naturally and continue with normal bathing routine.  Patient noted to have diffuse right hemiscrotal edema without warmth, purulence, or fluctuance today.  I explained that this is a normal finding and encouraged scrotal support including compressive underwear, scrotal elevation, and cryotherapy as needed.  Patient and his wife expressed understanding.

## 2019-07-31 ENCOUNTER — Encounter: Payer: Self-pay | Admitting: Physician Assistant

## 2019-07-31 ENCOUNTER — Other Ambulatory Visit: Payer: Self-pay

## 2019-07-31 ENCOUNTER — Ambulatory Visit (INDEPENDENT_AMBULATORY_CARE_PROVIDER_SITE_OTHER): Payer: Medicare Other | Admitting: Physician Assistant

## 2019-07-31 VITALS — BP 151/84 | HR 87 | Temp 98.9°F | Ht 62.0 in

## 2019-07-31 DIAGNOSIS — N99840 Postprocedural hematoma of a genitourinary system organ or structure following a genitourinary system procedure: Secondary | ICD-10-CM | POA: Diagnosis not present

## 2019-07-31 MED ORDER — CEPHALEXIN 500 MG PO CAPS: 500.0000 mg | ORAL_CAPSULE | Freq: Two times a day (BID) | ORAL | 0 refills | Status: DC

## 2019-07-31 NOTE — Patient Instructions (Signed)
1. Go pick up your antibiotics from [facility] and start taking them today. 2. Keep your wound covered with sterile gauze and plastic dressing (Tegaderm) or tape. Do you shower without covering your wound first. 3. Start wearing tight, compressive underwear or a jock strap to keep the swelling from coming back. 4. I will see you back in clinic on Tuesday to check on your wound. 5. If you develop fever, chills, nausea, vomiting, worse pain, or if your wound opens up more before Tuesday, go to the Emergency Room.

## 2019-07-31 NOTE — Progress Notes (Signed)
07/31/2019 11:11 AM   Malik Bridges 14-Feb-1951 [id number]  CC: Chief Complaint  Patient presents with  . Post-op Problem    HPI: Malik Bridges is a 69 y.o. male with PMH BPH with LUTS on Flomax and elevated PSA now POD 10 from right hydrocelectomy and right spermatocelectomy with Dr. Bernardo Heater who presents today for evaluation of right testicular pain.   Today, patient reports aggressively increased right testicular pressure and pain.  He states he has had abundant bloody drainage from his incision since surgery that has not decreased over time.  He denies fever, chills, nausea, and vomiting.  PMH: Past Medical History:  Diagnosis Date  . Abdominal hernia   . Asthma   . Diabetes mellitus ([place])    Pt taking Insulin  . Elevated PSA 05/2019  . Enlarged prostate   . Hydrocele, bilateral 06/2019  . Hyperlipidemia   . Hypertension     Surgical History: Past Surgical History:  Procedure Laterality Date  . CIRCUMCISION    . COLONOSCOPY WITH PROPOFOL N/A 05/15/2018   Procedure: COLONOSCOPY WITH PROPOFOL;  Surgeon: Lin Landsman, MD;  [facility] ENDOSCOPY;  Service: Gastroenterology;  Laterality: N/A;  . EYE SURGERY Bilateral    cataract extractions  . HYDROCELE EXCISION Right 07/21/2019   Procedure: HYDROCELECTOMY ADULT;  Surgeon: Abbie Sons, MD;  Location: [facility] ORS;  Service: Urology;  Laterality: Right;  . ROBOT ASSISTED INGUINAL HERNIA REPAIR Bilateral 05/01/2018   Procedure: ROBOT ASSISTED INGUINAL HERNIA REPAIR;  Surgeon: Jules Husbands, MD;  Location: [facility] ORS;  Service: General;  Laterality: Bilateral;  . UMBILICAL HERNIA REPAIR  05/01/2018   Procedure: HERNIA REPAIR UMBILICAL ADULT;  Surgeon: Jules Husbands, MD;  Location: [facility] ORS;  Service: General;;    Home Medications:  Allergies as of 07/31/2019      Reactions   Hydrochlorothiazide Other (See Comments)   K to 3.0 on Zestoretic 20-25      Medication List       Accurate as of Jul 31, 2019 11:11  AM. If you have any questions, ask your nurse or doctor.        STOP taking these medications   HYDROcodone-acetaminophen 5-325 MG tablet Commonly known as: NORCO/VICODIN Stopped by: Debroah Loop, PA-C     TAKE these medications   albuterol 108 (90 Base) MCG/ACT inhaler Commonly known as: VENTOLIN HFA Inhale 2 puffs into the lungs every 4 (four) hours as needed for wheezing or shortness of breath.   atorvastatin 20 MG tablet Commonly known as: LIPITOR Take 20 mg by mouth daily.   Deluxe Tablet Cutter Misc Use to cut metformin tablets.   Flovent HFA 110 MCG/ACT inhaler Generic drug: fluticasone Inhale 2 puffs into the lungs 2 (two) times daily.   insulin glargine 100 UNIT/ML injection Commonly known as: LANTUS Inject 30-60 Units into the skin See admin instructions. Inject 30 units in the morning and 60 units at night   lisinopril 20 MG tablet Commonly known as: ZESTRIL Take 20 mg by mouth daily.   metFORMIN 1000 MG tablet Commonly known as: GLUCOPHAGE Take 1,000 mg by mouth 2 (two) times daily with a meal.   naproxen 500 MG tablet Commonly known as: NAPROSYN Take 500 mg by mouth 2 (two) times daily.   Precision QID Test test strip Generic drug: glucose blood Check twice a day, E11.42   spironolactone 25 MG tablet Commonly known as: ALDACTONE Take 12.5 mg by mouth daily.   tamsulosin 0.4 MG Caps capsule  Commonly known as: FLOMAX Take 0.4 mg by mouth daily.   traZODone 50 MG tablet Commonly known as: DESYREL Take 50 mg by mouth at bedtime.   TRUEplus Pen Needles 31G X 6 MM Misc Generic drug: Insulin Pen Needle       Allergies:  Allergies  Allergen Reactions  . Hydrochlorothiazide Other (See Comments)    K to 3.0 on Zestoretic 20-25    Family History: No family history on file.  Social History:   reports that he has quit smoking. His smoking use included cigarettes. His smokeless tobacco use includes snuff. He reports previous alcohol  use. He reports that he does not use drugs.  Physical Exam: BP (!) 151/84   Pulse 87   Temp 98.9 F (37.2 C)   Ht 5\' 2"  (1.575 m)   BMI 30.91 kg/m   Constitutional:  Alert and oriented, no acute distress, nontoxic appearing HEENT: [place], AT Cardiovascular: No clubbing, cyanosis, or edema Respiratory: Normal respiratory effort, no increased work of breathing GU: Tense and swollen right testicle.  Ample oozing blood escaping from right hydrocelectomy incision.  Scrotum without purulence, warmth, or crepitus. Skin: No rashes, bruises or suspicious lesions Neurologic: Grossly intact, no focal deficits, moving all 4 extremities Psychiatric: Normal mood and affect  Assessment & Plan:   1. Postoperative hematoma involving genitourinary system following genitourinary procedure 69 year old male POD 10 from right hydrocelectomy and spermatocelectomy presents with increasing pressure and pain of the right testicle with continued ample bloody output from his surgical site. Physical exam reassuring for postop infection.  I prepped the patient's surgical incision with Betadine.  Using a sterile Q-tip, I was able to probe through the patient's incision between 2 adjacent sutures to the base of an approximate 7 cm hematoma within the right hemiscrotum.  I subsequently was able to express a significant amount of dark, oozing blood from the hematoma.  There was relief noted in right testicular tension with this and patient noted improvement in his discomfort.  Using 57mL of 1% lidocaine, I numbed the midpoint of the patient's surgical incision and expanded an approximate 1 cm portion of his hydrocelectomy incision using a #11 scalpel to ease drainage.  I subsequently irrigated the hematoma with sterile saline. Lastly, I covered the wound with sterile gauze and fixed it in place with a Tegaderm.  Prescribing prophylactic Keflex following in-office hematoma drainage and irrigation today. Counseled patient to keep  the wound covered and change dressing as needed, wear compressive underwear/jockstrap to prevent reaccumulation of fluid, and RTC next week for wound recheck. He expressed understanding. - cephALEXin (KEFLEX) 500 MG capsule; Take 1 capsule (500 mg total) by mouth 2 (two) times daily for 5 days.  Dispense: 10 capsule; Refill: 0   Return in about 4 days (around 08/04/2019) for Wound recheck.  Debroah Loop, PA-C  [facility] [address], [place], [place] [address] [phone]

## 2019-08-04 ENCOUNTER — Other Ambulatory Visit: Payer: Self-pay

## 2019-08-04 ENCOUNTER — Ambulatory Visit (INDEPENDENT_AMBULATORY_CARE_PROVIDER_SITE_OTHER): Payer: Medicare Other | Admitting: Physician Assistant

## 2019-08-04 ENCOUNTER — Encounter: Payer: Self-pay | Admitting: Physician Assistant

## 2019-08-04 ENCOUNTER — Ambulatory Visit: Payer: Medicare Other | Admitting: Physician Assistant

## 2019-08-04 DIAGNOSIS — N99840 Postprocedural hematoma of a genitourinary system organ or structure following a genitourinary system procedure: Secondary | ICD-10-CM

## 2019-08-04 MED ORDER — CEPHALEXIN 500 MG PO CAPS: 500.0000 mg | ORAL_CAPSULE | Freq: Two times a day (BID) | ORAL | 0 refills | Status: AC

## 2019-08-04 NOTE — Progress Notes (Signed)
08/04/2019 3:16 PM   Malik Bridges Mar 23, 1950 [id number]  CC: Chief Complaint  Patient presents with  . Wound Check    HPI: Malik Bridges is a 69 y.o. male with PMH diabetes on insulin who presents today for wound recheck.  I saw him most recently in clinic 4 days ago for evaluation of increased right testicular pain and pressure following hydrocelectomy and spermatocelectomy 10 days prior.  He was noted to have a large, approximate 60 cc right scrotal hematoma at that time.  I expressed the contents of the hematoma in clinic at that point, opened the midline of his surgical incision by 1 cm to facilitate drainage, irrigated the hematoma, and started the patient on prophylactic Keflex x5 days at that time with plans for wound recheck today.  Today, patient reports continued bleeding from the right testicle that has been soaking through his underwear and sheets.  He states he has not been wearing a jockstrap as previously instructed, as he could not afford one.  He continues to deny fever, chills, nausea, vomiting, and purulence or erythema at the wound.  He states his pain is improved since hematoma evacuation last week.  PMH: Past Medical History:  Diagnosis Date  . Abdominal hernia   . Asthma   . Diabetes mellitus ([place])    Pt taking Insulin  . Elevated PSA 05/2019  . Enlarged prostate   . Hydrocele, bilateral 06/2019  . Hyperlipidemia   . Hypertension     Surgical History: Past Surgical History:  Procedure Laterality Date  . CIRCUMCISION    . COLONOSCOPY WITH PROPOFOL N/A 05/15/2018   Procedure: COLONOSCOPY WITH PROPOFOL;  Surgeon: Lin Landsman, MD;  Location: [facility] ENDOSCOPY;  Service: Gastroenterology;  Laterality: N/A;  . EYE SURGERY Bilateral    cataract extractions  . HYDROCELE EXCISION Right 07/21/2019   Procedure: HYDROCELECTOMY ADULT;  Surgeon: Abbie Sons, MD;  Location: [facility] ORS;  Service: Urology;  Laterality: Right;  . ROBOT ASSISTED INGUINAL HERNIA  REPAIR Bilateral 05/01/2018   Procedure: ROBOT ASSISTED INGUINAL HERNIA REPAIR;  Surgeon: Jules Husbands, MD;  Location: [facility] ORS;  Service: General;  Laterality: Bilateral;  . UMBILICAL HERNIA REPAIR  05/01/2018   Procedure: HERNIA REPAIR UMBILICAL ADULT;  Surgeon: Jules Husbands, MD;  Location: [facility] ORS;  Service: General;;    Home Medications:  Allergies as of 08/04/2019      Reactions   Hydrochlorothiazide Other (See Comments)   K to 3.0 on Zestoretic 20-25      Medication List       Accurate as of August 04, 2019  3:16 PM. If you have any questions, ask your nurse or doctor.        albuterol 108 (90 Base) MCG/ACT inhaler Commonly known as: VENTOLIN HFA Inhale 2 puffs into the lungs every 4 (four) hours as needed for wheezing or shortness of breath.   atorvastatin 20 MG tablet Commonly known as: LIPITOR Take 20 mg by mouth daily.   cephALEXin 500 MG capsule Commonly known as: Keflex Take 1 capsule (500 mg total) by mouth 2 (two) times daily for 5 days.   Deluxe Tablet Cutter Misc Use to cut metformin tablets.   Flovent HFA 110 MCG/ACT inhaler Generic drug: fluticasone Inhale 2 puffs into the lungs 2 (two) times daily.   insulin glargine 100 UNIT/ML injection Commonly known as: LANTUS Inject 30-60 Units into the skin See admin instructions. Inject 30 units in the morning and 60 units at night  lisinopril 20 MG tablet Commonly known as: ZESTRIL Take 20 mg by mouth daily.   metFORMIN 1000 MG tablet Commonly known as: GLUCOPHAGE Take 1,000 mg by mouth 2 (two) times daily with a meal.   naproxen 500 MG tablet Commonly known as: NAPROSYN Take 500 mg by mouth 2 (two) times daily.   Precision QID Test test strip Generic drug: glucose blood Check twice a day, E11.42   spironolactone 25 MG tablet Commonly known as: ALDACTONE Take 12.5 mg by mouth daily.   tamsulosin 0.4 MG Caps capsule Commonly known as: FLOMAX Take 0.4 mg by mouth daily.   traZODone 50 MG  tablet Commonly known as: DESYREL Take 50 mg by mouth at bedtime.   TRUEplus Pen Needles 31G X 6 MM Misc Generic drug: Insulin Pen Needle       Allergies:  Allergies  Allergen Reactions  . Hydrochlorothiazide Other (See Comments)    K to 3.0 on Zestoretic 20-25    Family History: No family history on file.  Social History:   reports that he has quit smoking. His smoking use included cigarettes. His smokeless tobacco use includes snuff. He reports previous alcohol use. He reports that he does not use drugs.  Physical Exam: BP (!) 184/90 (BP Location: Left Arm, Patient Position: Sitting, Cuff Size: Normal)   Pulse 96   Ht 5\' 2"  (1.575 m)   Wt 162 lb (73.5 kg)   BMI 29.63 kg/m   Constitutional:  Alert and oriented, no acute distress, nontoxic appearing HEENT: [place], AT Cardiovascular: No clubbing, cyanosis, or edema Respiratory: Normal respiratory effort, no increased work of breathing GU: Interval expansion of the operative incision opening to 2x1 cm.  Significantly improved healing noted at the apices of the incision.  Significant decrease in oozing at the open wound with fixed wound borders.  Slightly increased fullness of the right testicle without crepitus, purulence, or fluctuance. Skin: No rashes, bruises or suspicious lesions Neurologic: Grossly intact, no focal deficits, moving all 4 extremities Psychiatric: Normal mood and affect  Assessment & Plan:   1. Postoperative hematoma involving genitourinary system following genitourinary procedure 69 year old male presents for wound recheck after having been treated for right testicular hematoma following hydrocelectomy and spermatocelectomy 4 days ago.  Incisional opening has expanded to 2x1 cm in the interim, however the apices of the wound were noted to be well-healed.  Open wound borders are fixed; I counseled the patient that I would expect them to heal through secondary intention in the coming weeks.  Counseled patient  to continue covering the open portion of the wound with sterile gauze as needed.  Counseled patient to wear tight underwear as an alternative to a jockstrap for scrotal compression.  He expressed understanding.  Given patient's history of diabetes, I am continuing his Keflex for an additional 5 days for a total of a 10-day course of 1000 mg daily.  Patient is due for routine postop follow-up in approximately 4 weeks.  Counseled him to contact our office if he continues to have bleeding 2 weeks from now; we will see him back in clinic for further evaluation if this occurs.  Additionally, counseled him on warning signs of infection including fever, chills, nausea, vomiting, worsened pain/redness/swelling, or purulent output. - cephALEXin (KEFLEX) 500 MG capsule; Take 1 capsule (500 mg total) by mouth 2 (two) times daily for 5 days.  Dispense: 10 capsule; Refill: 0  Return if symptoms worsen or fail to improve.  Debroah Loop, PA-C  [facility]  [address] [place], [place] [address] [phone]

## 2019-08-04 NOTE — Patient Instructions (Addendum)
1. Continue your current antibiotics; I have sent an additional 5 days' worth to your pharmacy. 2. Start wearing tighter underwear, even the same ones you wear now just one size smaller. 3. Make sure the wound is covered with sterile gauze. You may take the gauze off to bathe and let the water run over it, but replace the gauze once you get dressed. You may cover the gauze with absorbant pads or underwear to prevent leakage, if you would like. 4. Call our office immediately if you develop fever, chills, nausea, vomiting, worse pain or redness or swelling, pus starts coming out of the wound, or if the wound keeps bleeding two weeks from now. 5. I will plan to see you in clinic at the end of the month for your postop follow-up.

## 2019-09-01 ENCOUNTER — Encounter: Payer: Self-pay | Admitting: Physician Assistant

## 2019-09-01 ENCOUNTER — Other Ambulatory Visit: Payer: Self-pay

## 2019-09-01 ENCOUNTER — Ambulatory Visit (INDEPENDENT_AMBULATORY_CARE_PROVIDER_SITE_OTHER): Payer: Medicare Other | Admitting: Physician Assistant

## 2019-09-01 VITALS — BP 176/89 | HR 74 | Ht 62.0 in | Wt 156.6 lb

## 2019-09-01 DIAGNOSIS — Z4889 Encounter for other specified surgical aftercare: Secondary | ICD-10-CM

## 2019-09-01 NOTE — Progress Notes (Signed)
09/01/2019 9:43 AM   Malik Bridges Jul 17, 1950 [id number]  CC: Chief Complaint  Patient presents with  . Routine Post Op   HPI: Malik Bridges is a 69 y.o. male with PMH diabetes on insulin who presents today for 6 week postop wound check s/p right hydrocelectomy and spermatocelectomy with Dr. Bernardo Heater. He developed a postoperative scrotal hematoma that was managed in our clinic with drainage, irrigation, and antibiotics.   Today, patient reports resolution of scrotal pain. He does continue to have slight drainage from his wound and is concerned that the middle of it seems to heal over and reopen repeatedly. He reports he has not been bathing since surgery because he wasn't sure if he was permitted to do so.  He denies fever, chills, nausea, and vomiting.  PMH: Past Medical History:  Diagnosis Date  . Abdominal hernia   . Asthma   . Diabetes mellitus ([place])    Pt taking Insulin  . Elevated PSA 05/2019  . Enlarged prostate   . Hydrocele, bilateral 06/2019  . Hyperlipidemia   . Hypertension     Surgical History: Past Surgical History:  Procedure Laterality Date  . CIRCUMCISION    . COLONOSCOPY WITH PROPOFOL N/A 05/15/2018   Procedure: COLONOSCOPY WITH PROPOFOL;  Surgeon: Lin Landsman, MD;  Location: [facility] ENDOSCOPY;  Service: Gastroenterology;  Laterality: N/A;  . EYE SURGERY Bilateral    cataract extractions  . HYDROCELE EXCISION Right 07/21/2019   Procedure: HYDROCELECTOMY ADULT;  Surgeon: Abbie Sons, MD;  Location: [facility] ORS;  Service: Urology;  Laterality: Right;  . ROBOT ASSISTED INGUINAL HERNIA REPAIR Bilateral 05/01/2018   Procedure: ROBOT ASSISTED INGUINAL HERNIA REPAIR;  Surgeon: Jules Husbands, MD;  Location: [facility] ORS;  Service: General;  Laterality: Bilateral;  . UMBILICAL HERNIA REPAIR  05/01/2018   Procedure: HERNIA REPAIR UMBILICAL ADULT;  Surgeon: Jules Husbands, MD;  Location: [facility] ORS;  Service: General;;    Home Medications:  Allergies as of  09/01/2019      Reactions   Hydrochlorothiazide Other (See Comments)   K to 3.0 on Zestoretic 20-25      Medication List       Accurate as of September 01, 2019  9:43 AM. If you have any questions, ask your nurse or doctor.        albuterol 108 (90 Base) MCG/ACT inhaler Commonly known as: VENTOLIN HFA Inhale 2 puffs into the lungs every 4 (four) hours as needed for wheezing or shortness of breath.   atorvastatin 20 MG tablet Commonly known as: LIPITOR Take 20 mg by mouth daily.   Deluxe Tablet Cutter Misc Use to cut metformin tablets.   diclofenac Sodium 1 % Gel Commonly known as: VOLTAREN Apply topically.   Flovent HFA 110 MCG/ACT inhaler Generic drug: fluticasone Inhale 2 puffs into the lungs 2 (two) times daily.   insulin glargine 100 UNIT/ML injection Commonly known as: LANTUS Inject 30-60 Units into the skin See admin instructions. Inject 30 units in the morning and 60 units at night   lisinopril 20 MG tablet Commonly known as: ZESTRIL Take 20 mg by mouth daily.   metFORMIN 1000 MG tablet Commonly known as: GLUCOPHAGE Take 1,000 mg by mouth 2 (two) times daily with a meal.   naproxen 500 MG tablet Commonly known as: NAPROSYN Take 500 mg by mouth 2 (two) times daily.   Precision QID Test test strip Generic drug: glucose blood Check twice a day, E11.42   spironolactone 25 MG tablet Commonly  known as: ALDACTONE Take 12.5 mg by mouth daily.   tamsulosin 0.4 MG Caps capsule Commonly known as: FLOMAX Take 0.4 mg by mouth daily.   traZODone 50 MG tablet Commonly known as: DESYREL Take 50 mg by mouth at bedtime.   TRUEplus Pen Needles 31G X 6 MM Misc Generic drug: Insulin Pen Needle       Allergies:  Allergies  Allergen Reactions  . Hydrochlorothiazide Other (See Comments)    K to 3.0 on Zestoretic 20-25    Family History: No family history on file.  Social History:   reports that he has quit smoking. His smoking use included cigarettes. His  smokeless tobacco use includes snuff. He reports previous alcohol use. He reports that he does not use drugs.  Physical Exam: BP (!) 176/89 (BP Location: Left Arm, Patient Position: Sitting, Cuff Size: Normal)   Pulse 74   Ht 5\' 2"  (1.575 m)   Wt 156 lb 9.6 oz (71 kg)   BMI 28.64 kg/m   Constitutional:  Alert and oriented, no acute distress, nontoxic appearing HEENT: [place], AT Cardiovascular: No clubbing, cyanosis, or edema Respiratory: Normal respiratory effort, no increased work of breathing GU: Right testicular and scrotal edema without purulence, fluctuance, or crepitus. Surgical incision visualized with well-healed apices and a residual opening at the midline measuring approximately 64mm draining small amounts of dark, serosanguineous fluid. Viable-appearing testicle visualized at the base of the opening. Minimal tenderness on exam, unable to express purulence. Skin: No rashes, bruises or suspicious lesions Neurologic: Grossly intact, no focal deficits, moving all 4 extremities Psychiatric: Normal mood and affect  Assessment & Plan:   1. Encounter for postoperative wound check S/p right hydrocelectomy and spermatocelectomy with postoperative hematoma. Physical exam today notable for persistent opening at the midline of the incision draining small volumes of serosanguineous fluid, concerning for persistent hematoma vs. Seroma. Physical exam, vitals, patient report reassuring for infection today.  Recommend wound recheck with Dr. Bernardo Heater tomorrow. Patient may require scrotal exploration with closure; alternatively may consider conservative management with wound packing.  Counseled patient that he may resume showering but should avoid baths or soaking the open wound in water.  Return in about 1 day (around 09/02/2019) for Wound recheck with Dr. Bernardo Heater.  Debroah Loop, PA-C  [facility] [address] [place],  [address] [phone]

## 2019-09-02 ENCOUNTER — Ambulatory Visit (INDEPENDENT_AMBULATORY_CARE_PROVIDER_SITE_OTHER): Payer: Medicare Other | Admitting: Urology

## 2019-09-02 ENCOUNTER — Encounter: Payer: Self-pay | Admitting: Urology

## 2019-09-02 VITALS — BP 156/75 | HR 67 | Ht 64.0 in | Wt 157.3 lb

## 2019-09-02 DIAGNOSIS — Z9889 Other specified postprocedural states: Secondary | ICD-10-CM

## 2019-09-02 DIAGNOSIS — S3022XA Contusion of scrotum and testes, initial encounter: Secondary | ICD-10-CM

## 2019-09-02 NOTE — Progress Notes (Signed)
08/04/19 8:10 AM   Malik Bridges January 16, 1951 [id number]  Referring provider: Kerri Perches, PA-C [place],  [place] [address] No chief complaint on file.   HPI: Malik Bridges is a 69 y.o. presents for a follow up.  -Hydrocelectomy perform 07/21/19. Developed a postoperative scrotal hematoma that was managed in our clinic with drainage, irrigation, and antibiotics. -Noted "bleeding form the area," changed dressings 3 times a day -Saw Malik Bridges yesterday who recommended follow-up wound check with me -No fever, chills -No significant pain  PMH: Past Medical History:  Diagnosis Date  . Abdominal hernia   . Asthma   . Diabetes mellitus ([place])    Pt taking Insulin  . Elevated PSA 05/2019  . Enlarged prostate   . Hydrocele, bilateral 06/2019  . Hyperlipidemia   . Hypertension     Surgical History: Past Surgical History:  Procedure Laterality Date  . CIRCUMCISION    . COLONOSCOPY WITH PROPOFOL N/A 05/15/2018   Procedure: COLONOSCOPY WITH PROPOFOL;  Surgeon: Lin Landsman, MD;  Location: [facility] ENDOSCOPY;  Service: Gastroenterology;  Laterality: N/A;  . EYE SURGERY Bilateral    cataract extractions  . HYDROCELE EXCISION Right 07/21/2019   Procedure: HYDROCELECTOMY ADULT;  Surgeon: Abbie Sons, MD;  Location: [facility] ORS;  Service: Urology;  Laterality: Right;  . ROBOT ASSISTED INGUINAL HERNIA REPAIR Bilateral 05/01/2018   Procedure: ROBOT ASSISTED INGUINAL HERNIA REPAIR;  Surgeon: Jules Husbands, MD;  Location: [facility] ORS;  Service: General;  Laterality: Bilateral;  . UMBILICAL HERNIA REPAIR  05/01/2018   Procedure: HERNIA REPAIR UMBILICAL ADULT;  Surgeon: Jules Husbands, MD;  Location: [facility] ORS;  Service: General;;    Home Medications:  Allergies as of 09/02/2019      Reactions   Hydrochlorothiazide Other (See Comments)   K to 3.0 on Zestoretic 20-25      Medication List       Accurate as of September 02, 2019  8:10 AM. If you have any questions,  ask your nurse or doctor.        albuterol 108 (90 Base) MCG/ACT inhaler Commonly known as: VENTOLIN HFA Inhale 2 puffs into the lungs every 4 (four) hours as needed for wheezing or shortness of breath.   atorvastatin 20 MG tablet Commonly known as: LIPITOR Take 20 mg by mouth daily.   Deluxe Tablet Cutter Misc Use to cut metformin tablets.   diclofenac Sodium 1 % Gel Commonly known as: VOLTAREN Apply topically.   Flovent HFA 110 MCG/ACT inhaler Generic drug: fluticasone Inhale 2 puffs into the lungs 2 (two) times daily.   insulin glargine 100 UNIT/ML injection Commonly known as: LANTUS Inject 30-60 Units into the skin See admin instructions. Inject 30 units in the morning and 60 units at night   lisinopril 20 MG tablet Commonly known as: ZESTRIL Take 20 mg by mouth daily.   metFORMIN 1000 MG tablet Commonly known as: GLUCOPHAGE Take 1,000 mg by mouth 2 (two) times daily with a meal.   naproxen 500 MG tablet Commonly known as: NAPROSYN Take 500 mg by mouth 2 (two) times daily.   Precision QID Test test strip Generic drug: glucose blood Check twice a day, E11.42   spironolactone 25 MG tablet Commonly known as: ALDACTONE Take 12.5 mg by mouth daily.   tamsulosin 0.4 MG Caps capsule Commonly known as: FLOMAX Take 0.4 mg by mouth daily.   traZODone 50 MG tablet Commonly known as: DESYREL Take 50 mg by mouth at  bedtime.   TRUEplus Pen Needles 31G X 6 MM Misc Generic drug: Insulin Pen Needle       Allergies:  Allergies  Allergen Reactions  . Hydrochlorothiazide Other (See Comments)    K to 3.0 on Zestoretic 20-25    Family History: No family history on file.  Social History:  reports that he has quit smoking. His smoking use included cigarettes. His smokeless tobacco use includes snuff. He reports previous alcohol use. He reports that he does not use drugs.   Physical Exam: There were no vitals taken for this visit.  Constitutional:  Alert and  oriented, No acute distress. HEENT: [place] AT, moist mucus membranes.  Trachea midline, no masses. Cardiovascular: No clubbing, cyanosis, or edema. Respiratory: Normal respiratory effort, no increased work of breathing. GI: Abdomen is soft, nontender, nondistended, no abdominal masses GU: No CVA tenderness. Induration of the right scrotum without tenderness, no fluctuance. Small area of granulation tissue, draining minimal serosanguinous fluid.  Skin: No rashes, bruises or suspicious lesions. Neurologic: Grossly intact, no focal deficits, moving all 4 extremities. Psychiatric: Normal mood and affect.  Laboratory Data:  Lab Results  Component Value Date   CREATININE 0.70 07/21/2019    Assessment & Plan:    1.  Status post hydrocelectomy with postop hematoma -Would not recommend exploration and continue conservative management with dressing changes -1 month follow up for a wound check  [facility] [address], [place], [place] [address] [phone]  I, Joneen Boers Peace, am acting as a Education administrator for Dr. Nicki Reaper C. Athanasius Kesling.  I have reviewed the above documentation for accuracy and completeness, and I agree with the above.   Abbie Sons, MD

## 2019-09-03 ENCOUNTER — Encounter: Payer: Self-pay | Admitting: Urology

## 2019-09-30 ENCOUNTER — Ambulatory Visit (INDEPENDENT_AMBULATORY_CARE_PROVIDER_SITE_OTHER): Payer: Medicare Other | Admitting: Physician Assistant

## 2019-09-30 ENCOUNTER — Encounter: Payer: Self-pay | Admitting: Physician Assistant

## 2019-09-30 VITALS — BP 138/57 | HR 67 | Ht 64.0 in | Wt 157.0 lb

## 2019-09-30 DIAGNOSIS — Z4889 Encounter for other specified surgical aftercare: Secondary | ICD-10-CM

## 2019-09-30 NOTE — Progress Notes (Signed)
09/30/2019 3:24 PM   Malik Bridges 20-Mar-1950 [id number]  CC: Chief Complaint  Patient presents with  . Wound Check    57mo follow up   HPI: Malik Bridges is a 69 y.o. male with PMH diabetes on insulin who presents today for follow-up wound check s/p right hydrocelectomy and spermatocelectomy with Dr. Bernardo Heater.  He developed a postoperative scrotal hematoma that was managed in our clinic with drainage, irrigation, and antibiotics.  Today, patient reports resolution of scrotal pain and drainage.  He states his scrotal wound stopped draining approximately 2 weeks ago and he has had no further problems since.  He denies fever, chills, nausea, and vomiting.    PMH: Past Medical History:  Diagnosis Date  . Abdominal hernia   . Asthma   . Diabetes mellitus ([place])    Pt taking Insulin  . Elevated PSA 05/2019  . Enlarged prostate   . Hydrocele, bilateral 06/2019  . Hyperlipidemia   . Hypertension     Surgical History: Past Surgical History:  Procedure Laterality Date  . CIRCUMCISION    . COLONOSCOPY WITH PROPOFOL N/A 05/15/2018   Procedure: COLONOSCOPY WITH PROPOFOL;  Surgeon: Lin Landsman, MD;  Location: [facility] ENDOSCOPY;  Service: Gastroenterology;  Laterality: N/A;  . EYE SURGERY Bilateral    cataract extractions  . HYDROCELE EXCISION Right 07/21/2019   Procedure: HYDROCELECTOMY ADULT;  Surgeon: Abbie Sons, MD;  Location: [facility] ORS;  Service: Urology;  Laterality: Right;  . ROBOT ASSISTED INGUINAL HERNIA REPAIR Bilateral 05/01/2018   Procedure: ROBOT ASSISTED INGUINAL HERNIA REPAIR;  Surgeon: Jules Husbands, MD;  Location: [facility] ORS;  Service: General;  Laterality: Bilateral;  . UMBILICAL HERNIA REPAIR  05/01/2018   Procedure: HERNIA REPAIR UMBILICAL ADULT;  Surgeon: Jules Husbands, MD;  Location: [facility] ORS;  Service: General;;    Home Medications:  Allergies as of 09/30/2019      Reactions   Hydrochlorothiazide Other (See Comments)   K to 3.0 on Zestoretic 20-25        Medication List       Accurate as of September 30, 2019  3:24 PM. If you have any questions, ask your nurse or doctor.        albuterol 108 (90 Base) MCG/ACT inhaler Commonly known as: VENTOLIN HFA Inhale 2 puffs into the lungs every 4 (four) hours as needed for wheezing or shortness of breath.   atorvastatin 20 MG tablet Commonly known as: LIPITOR Take 20 mg by mouth daily.   Deluxe Tablet Cutter Misc Use to cut metformin tablets.   diclofenac Sodium 1 % Gel Commonly known as: VOLTAREN Apply topically.   Flovent HFA 110 MCG/ACT inhaler Generic drug: fluticasone Inhale 2 puffs into the lungs 2 (two) times daily.   insulin glargine 100 UNIT/ML injection Commonly known as: LANTUS Inject 30-60 Units into the skin See admin instructions. Inject 30 units in the morning and 60 units at night   Jardiance 10 MG Tabs tablet Generic drug: empagliflozin Take 10 mg by mouth daily.   lisinopril 20 MG tablet Commonly known as: ZESTRIL Take 20 mg by mouth daily.   metFORMIN 1000 MG tablet Commonly known as: GLUCOPHAGE Take 1,000 mg by mouth 2 (two) times daily with a meal.   naproxen 500 MG tablet Commonly known as: NAPROSYN Take 500 mg by mouth 2 (two) times daily.   Precision QID Test test strip Generic drug: glucose blood Check twice a day, E11.42   spironolactone 25 MG tablet Commonly known  as: ALDACTONE Take 12.5 mg by mouth daily.   tamsulosin 0.4 MG Caps capsule Commonly known as: FLOMAX Take 0.4 mg by mouth daily.   traZODone 50 MG tablet Commonly known as: DESYREL Take 50 mg by mouth at bedtime.   TRUEplus Pen Needles 31G X 6 MM Misc Generic drug: Insulin Pen Needle       Allergies:  Allergies  Allergen Reactions  . Hydrochlorothiazide Other (See Comments)    K to 3.0 on Zestoretic 20-25    Family History: No family history on file.  Social History:   reports that he has quit smoking. His smoking use included cigarettes. His smokeless tobacco  use includes snuff. He reports previous alcohol use. He reports that he does not use drugs.  Physical Exam: BP (!) 138/57   Pulse 67   Ht 5\' 4"  (1.626 m)   Wt 157 lb (71.2 kg)   BMI 26.95 kg/m   Constitutional:  Alert and oriented, no acute distress, nontoxic appearing HEENT: [place], AT Cardiovascular: No clubbing, cyanosis, or edema Respiratory: Normal respiratory effort, no increased work of breathing GU: Well-healed scrotal incision without drainage.  Some residual right scrotal edema noted, nontender and without fluctuance or induration. Neurologic: Grossly intact, no focal deficits, moving all 4 extremities Psychiatric: Normal mood and affect  Assessment & Plan:   1. Encounter for postoperative wound check Well healed on physical exam today, drainage resolved with no evidence of abscess or cellulitis. Counseled him that residual edema will continue to resolve on its own. Follow up as needed.  Return if symptoms worsen or fail to improve.  Debroah Loop, PA-C  [facility] [address] [place], [place] [address] [phone]

## 2019-11-03 IMAGING — CT CT ABD-PELV W/ CM
2 of 5 series · 15 of 46 positions shown, 17 images · IV contrast (iopamidol)
Comparison: None.

CLINICAL DATA: Pt c/o Right groin pain worsening x 4-5 months. Pt
states the pain is worse when bending over. He states when he walks
sometimes it gets worse as well. NKI No hx CA. No hx surg.

EXAM:
CT ABDOMEN AND PELVIS WITH CONTRAST
TECHNIQUE: Multidetector CT imaging of the abdomen and pelvis was performed
using the standard protocol following bolus administration of
intravenous contrast.
CONTRAST:  100 mL [id number] IOPAMIDOL ([id number]) INJECTION 61%

[Series 2: abd pelvis · axial · 0.69mm/px · z∈[-1604,-1154]mm · 12 of 102 slices shown, 14 images (1 of 2)]
[im 6/102  soft-tissue]
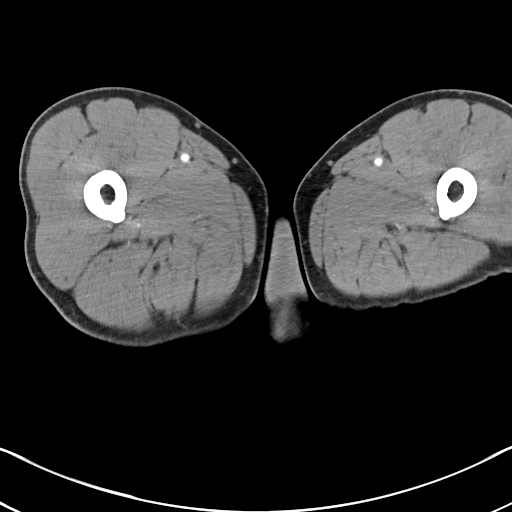
[im 6/102  bone]
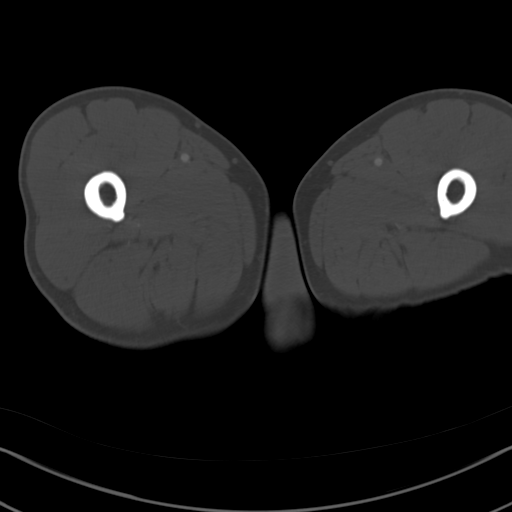
[im 16/102  soft-tissue]
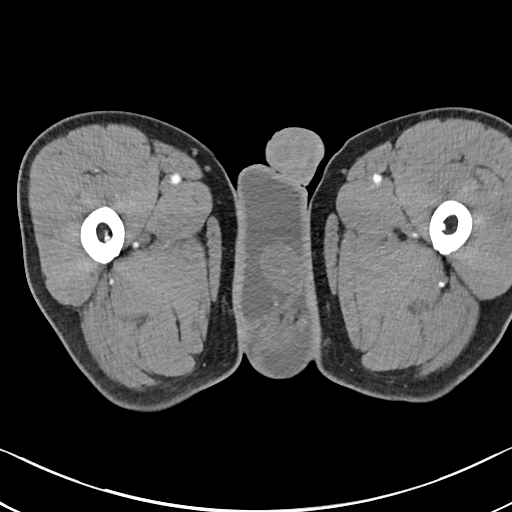
[im 21/102  soft-tissue]
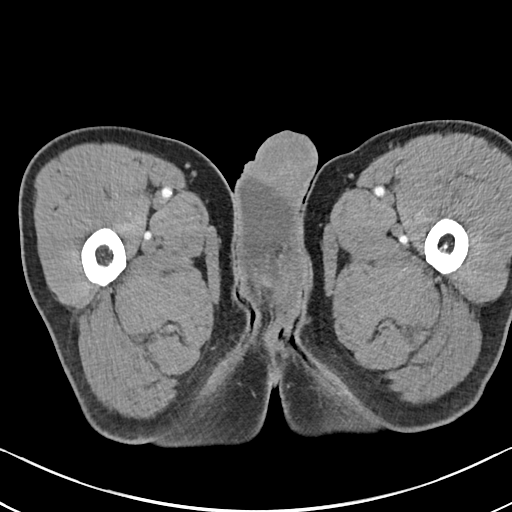
[im 31/102  soft-tissue]
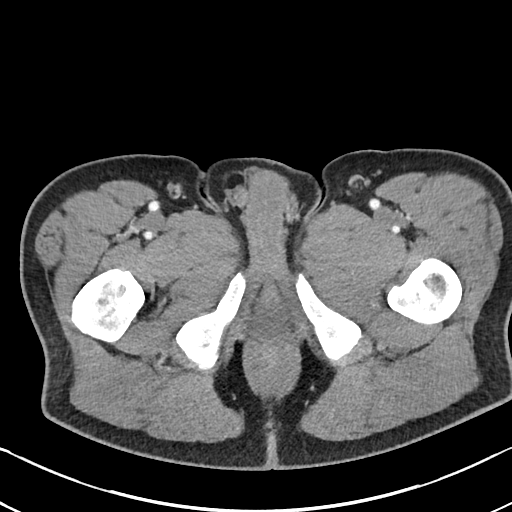
[im 41/102  soft-tissue]
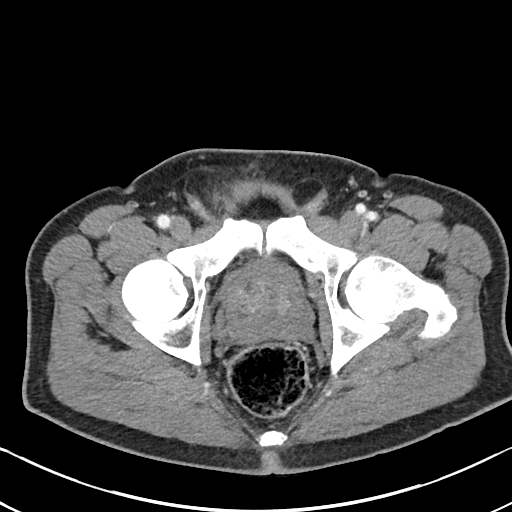
[im 46/102  soft-tissue]
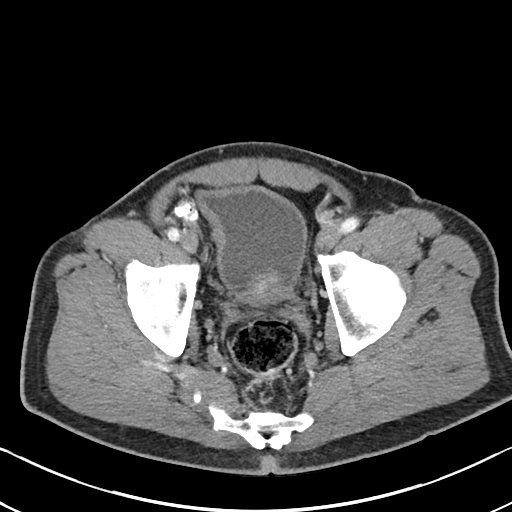
[im 56/102  soft-tissue]
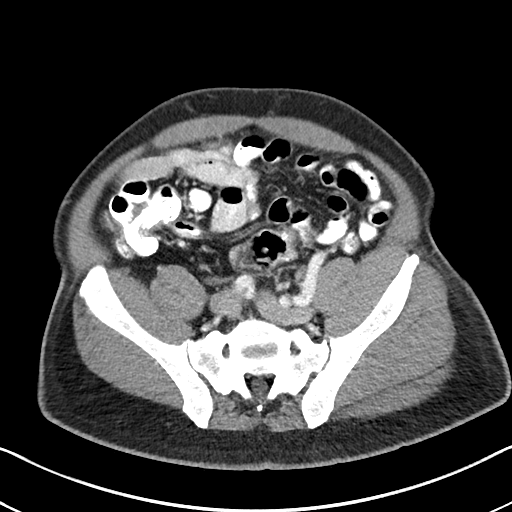
[im 61/102  soft-tissue]
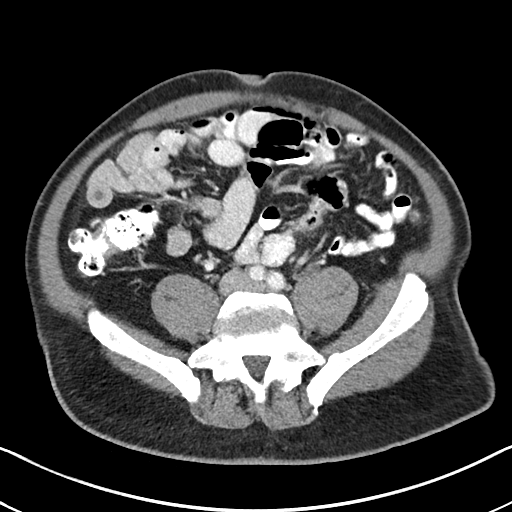
[im 71/102  soft-tissue]
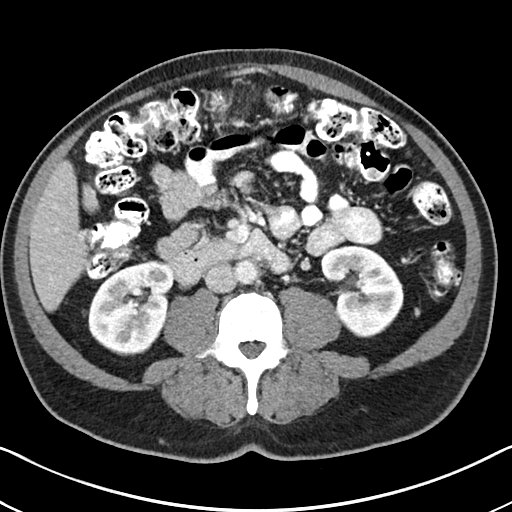
[im 71/102  bone]
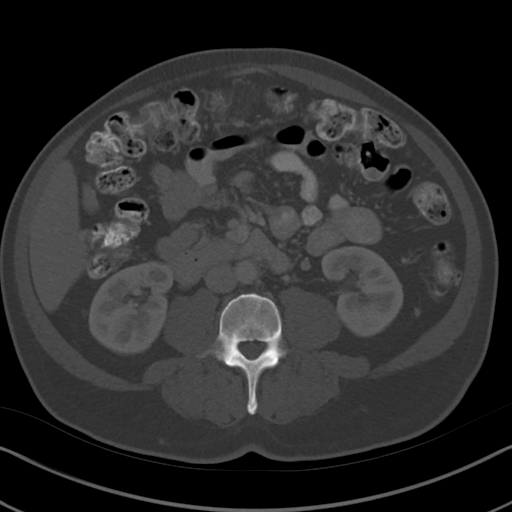
[im 81/102  soft-tissue]
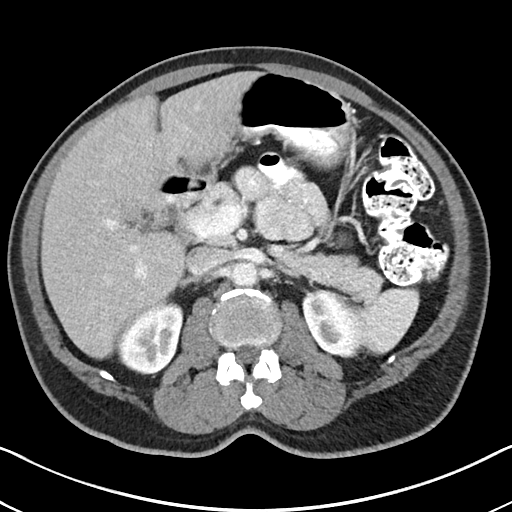
[im 86/102  soft-tissue]
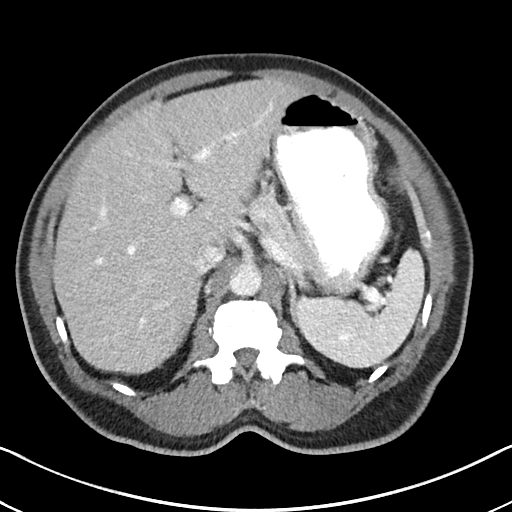
[im 96/102  soft-tissue]
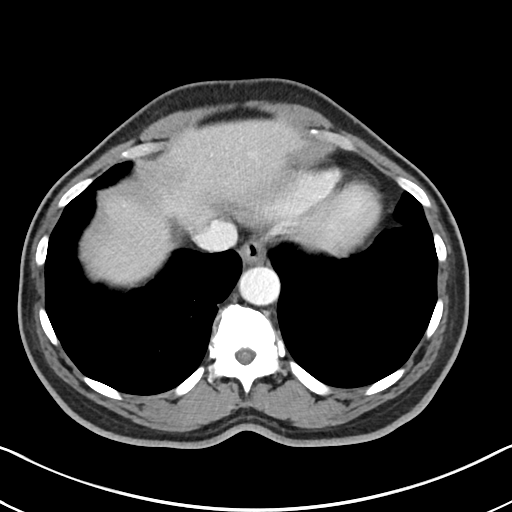

[Series 4: abd pelvis · coronal · 0.69mm/px · 3 of 151 slices shown (2 of 2)]
[im 51/151  soft-tissue]
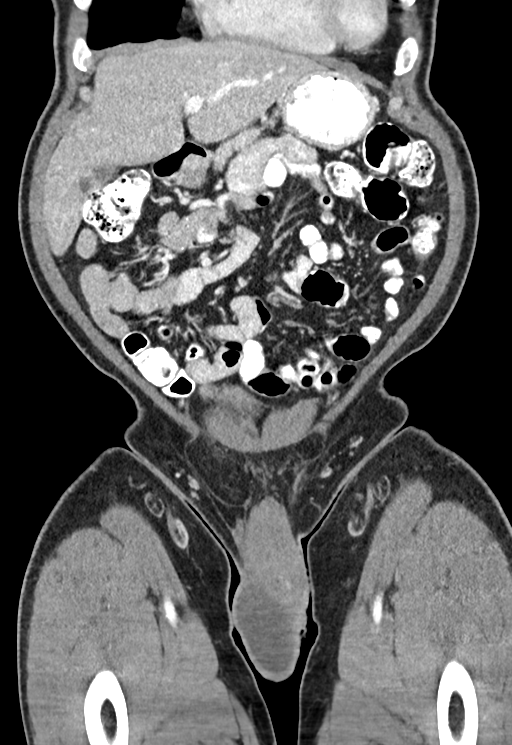
[im 67/151  soft-tissue]
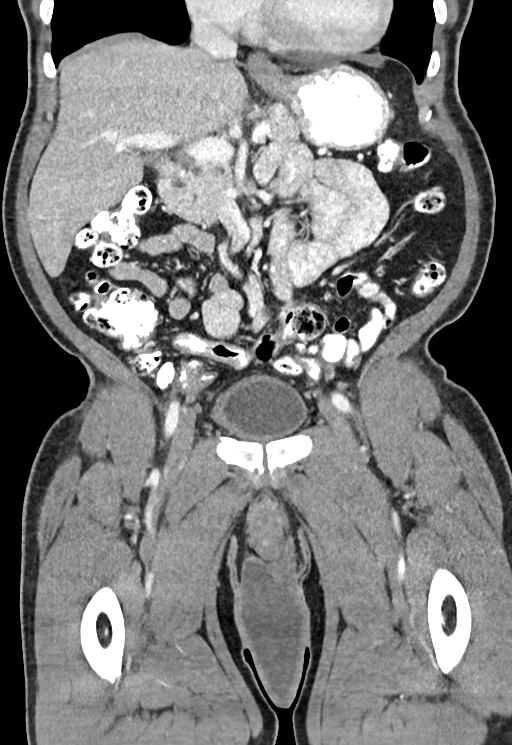
[im 84/151  soft-tissue]
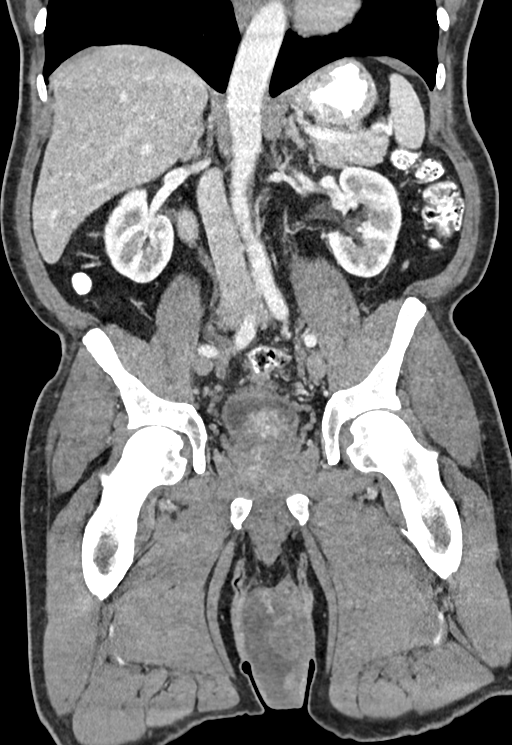

[15 of 46 positions shown; findings below may reference images not displayed]

FINDINGS: Lower chest: Clear lung bases.  Heart normal in size.

Hepatobiliary: 5 mm low-density lesion, segment 2, consistent with a
cyst. Liver normal in size. No other masses or lesions. Normal
gallbladder. No bile duct dilation.

Pancreas: 1 cm cystic mass in the pancreatic head adjacent to or
contiguous with the distal common bile duct. No other pancreatic
masses. No inflammation.

Spleen: Normal in size without focal abnormality.

Adrenals/Urinary Tract: Adrenal glands are unremarkable. Kidneys are
normal, without renal calculi, focal lesion, or hydronephrosis.
Bladder is unremarkable.

Stomach/Bowel: Stomach is within normal limits. Appendix appears
normal. No evidence of bowel wall thickening, distention, or
inflammatory changes.

Vascular/Lymphatic: Mild aortic atherosclerosis. No aneurysm.
Vessels otherwise unremarkable. No adenopathy.

Reproductive: Enlarged prostate measuring 5.7 x 5.3 x 5.4 cm.
Prostate elevates the bladder base.

Other: Fat containing right inguinal hernia. No bowel enters this.
Bilateral scrotal hydroceles. Small fat containing umbilical hernia.
No ascites.

Musculoskeletal: No fracture or acute finding. No osteoblastic or
osteolytic lesions. Mild endplate spurring throughout the visualized
spine.
IMPRESSION: 1. No acute findings within the abdomen or pelvis.
2. 1 cm cystic mass in the pancreatic head adjacent to or contiguous
with the distal common bile duct. Based on current consensus
guidelines, recommend follow-up CT imaging of the pancreas every 2
years for a total of 10 years.
3. Enlarged prostate gland.
4. Fat containing right inguinal hernia.
5. Bilateral hydroceles.
6. Mild aortic atherosclerosis.

## 2020-01-15 ENCOUNTER — Other Ambulatory Visit: Payer: Self-pay

## 2020-01-15 ENCOUNTER — Ambulatory Visit (INDEPENDENT_AMBULATORY_CARE_PROVIDER_SITE_OTHER): Payer: Medicare Other | Admitting: Urology

## 2020-01-15 ENCOUNTER — Encounter: Payer: Self-pay | Admitting: Urology

## 2020-01-15 VITALS — BP 151/76 | HR 69 | Ht 64.0 in | Wt 158.7 lb

## 2020-01-15 DIAGNOSIS — N400 Enlarged prostate without lower urinary tract symptoms: Secondary | ICD-10-CM | POA: Diagnosis not present

## 2020-01-15 DIAGNOSIS — R972 Elevated prostate specific antigen [PSA]: Secondary | ICD-10-CM | POA: Diagnosis not present

## 2020-01-15 DIAGNOSIS — R3912 Poor urinary stream: Secondary | ICD-10-CM

## 2020-01-15 MED ORDER — TAMSULOSIN HCL 0.4 MG PO CAPS: 0.4000 mg | ORAL_CAPSULE | Freq: Every day | ORAL | 3 refills | Status: DC

## 2020-01-15 NOTE — Progress Notes (Signed)
01/15/2020 9:27 AM   Malik Bridges [birth year] [id number]  Referring provider: Kerri Perches, PA-C [name],  [name] [address]  Chief Complaint  Patient presents with  . Follow-up    HPI:  F/u -   1) PSA elevation - pt seen for BPH Jul 2020when a PSA was noted to be 5.2. He did have a CT scan of the abdomen and pelvis on 01/09/2018 which revealed an 80 g prostate. There was no lymphadenopathy or bony lesions.  -Mar 2021 PSA 6.7  -Apr 2021 Biopsy, benign, prostate 93 grams   2) BPH-as above prostate about 80 - 90 g on imaging. He complained of intermittent flow and urgency. Occasional weak stream. He is on tamsulosin. AUASS = 15, QOL - unhappy.PVR 0 ml. Some frequency, weak stream.  3) hydroceles-scrotal ultrasound revealed a right inguinal hernia and bilateral right greater than left hydroceles. The hydroceles are heavy and bothersome to the patient. On exam he had a large right hydrocele and smaller left hydrocele. Dr. Dahlia Byes did bilater ing hernia repair Feb 2020. He underwent Right hydrocelectomy and Right spermatocelectomy with Dr. Bernardo Heater May 2021.   He returns in management of the above. He continues tamsulosin. Voiding adequately.     PMH: Past Medical History:  Diagnosis Date  . Abdominal hernia   . Asthma   . Diabetes mellitus ([place])    Pt taking Insulin  . Elevated PSA 05/2019  . Enlarged prostate   . Hydrocele, bilateral 06/2019  . Hyperlipidemia   . Hypertension     Surgical History: Past Surgical History:  Procedure Laterality Date  . CIRCUMCISION    . COLONOSCOPY WITH PROPOFOL N/A 05/15/2018   Procedure: COLONOSCOPY WITH PROPOFOL;  Surgeon: Lin Landsman, MD;  Location: [facility] ENDOSCOPY;  Service: Gastroenterology;  Laterality: N/A;  . EYE SURGERY Bilateral    cataract extractions  . HYDROCELE EXCISION Right 07/21/2019   Procedure: HYDROCELECTOMY ADULT;  Surgeon: Abbie Sons, MD;  Location: [facility] ORS;  Service:  Urology;  Laterality: Right;  . ROBOT ASSISTED INGUINAL HERNIA REPAIR Bilateral 05/01/2018   Procedure: ROBOT ASSISTED INGUINAL HERNIA REPAIR;  Surgeon: Jules Husbands, MD;  Location: [facility] ORS;  Service: General;  Laterality: Bilateral;  . UMBILICAL HERNIA REPAIR  05/01/2018   Procedure: HERNIA REPAIR UMBILICAL ADULT;  Surgeon: Jules Husbands, MD;  Location: [facility] ORS;  Service: General;;    Home Medications:  Allergies as of 01/15/2020      Reactions   Hydrochlorothiazide Other (See Comments)   K to 3.0 on Zestoretic 20-25      Medication List       Accurate as of January 15, 2020  9:27 AM. If you have any questions, ask your nurse or doctor.        Accu-Chek Guide Me w/Device Kit   Accu-Chek Softclix Lancets lancets SMARTSIG:Topical   albuterol 108 (90 Base) MCG/ACT inhaler Commonly known as: VENTOLIN HFA Inhale 2 puffs into the lungs every 4 (four) hours as needed for wheezing or shortness of breath.   atorvastatin 20 MG tablet Commonly known as: LIPITOR Take 20 mg by mouth daily.   Deluxe Tablet Cutter Misc Use to cut metformin tablets.   diclofenac Sodium 1 % Gel Commonly known as: VOLTAREN Apply topically.   Flovent HFA 110 MCG/ACT inhaler Generic drug: fluticasone Inhale 2 puffs into the lungs 2 (two) times daily.   insulin glargine 100 UNIT/ML injection Commonly known as: LANTUS Inject 30-60 Units into the skin See  admin instructions. Inject 30 units in the morning and 60 units at night   Jardiance 10 MG Tabs tablet Generic drug: empagliflozin Take 10 mg by mouth daily.   lisinopril 20 MG tablet Commonly known as: ZESTRIL Take 20 mg by mouth daily.   metFORMIN 1000 MG tablet Commonly known as: GLUCOPHAGE Take 1,000 mg by mouth 2 (two) times daily with a meal.   naproxen 500 MG tablet Commonly known as: NAPROSYN Take 500 mg by mouth 2 (two) times daily.   Precision QID Test test strip Generic drug: glucose blood Check twice a day, E11.42     spironolactone 25 MG tablet Commonly known as: ALDACTONE Take 12.5 mg by mouth daily.   tamsulosin 0.4 MG Caps capsule Commonly known as: FLOMAX Take 0.4 mg by mouth daily.   traZODone 50 MG tablet Commonly known as: DESYREL Take 50 mg by mouth at bedtime.   TRUEplus Pen Needles 31G X 6 MM Misc Generic drug: Insulin Pen Needle       Allergies:  Allergies  Allergen Reactions  . Hydrochlorothiazide Other (See Comments)    K to 3.0 on Zestoretic 20-25    Family History: No family history on file.  Social History:  reports that he has quit smoking. His smoking use included cigarettes. His smokeless tobacco use includes snuff. He reports previous alcohol use. He reports that he does not use drugs.   Physical Exam: BP (!) 151/76 (BP Location: Left Arm, Patient Position: Sitting, Cuff Size: Normal)   Pulse 69   Ht '5\' 4"'  (1.626 m)   Wt 158 lb 11.2 oz (72 kg)   BMI 27.24 kg/m   Constitutional:  Alert and oriented, No acute distress. HEENT: [place] AT, moist mucus membranes.  Trachea midline, no masses. Cardiovascular: No clubbing, cyanosis, or edema. Respiratory: Normal respiratory effort, no increased work of breathing. GI: Abdomen is soft, nontender, nondistended, no abdominal masses GU: No CVA tenderness Lymph: No cervical or inguinal lymphadenopathy. Skin: No rashes, bruises or suspicious lesions. Neurologic: Grossly intact, no focal deficits, moving all 4 extremities. Psychiatric: Normal mood and affect.  Laboratory Data: Lab Results  Component Value Date   WBC 4.3 07/17/2019   HGB 16.3 07/21/2019   HCT 48.0 07/21/2019   MCV 84.4 07/17/2019   PLT 183 07/17/2019    Lab Results  Component Value Date   CREATININE 0.70 07/21/2019    No results found for: PSA  No results found for: TESTOSTERONE  No results found for: HGBA1C  Urinalysis    Component Value Date/Time   APPEARANCEUR Clear 05/22/2019 0935   GLUCOSEU 1+ (A) 05/22/2019 0935   BILIRUBINUR  Negative 05/22/2019 0935   PROTEINUR 2+ (A) 05/22/2019 0935   NITRITE Negative 05/22/2019 0935   LEUKOCYTESUR Negative 05/22/2019 0935    Lab Results  Component Value Date   LABMICR See below: 05/22/2019   WBCUA 0-5 05/22/2019   LABEPIT 0-10 05/22/2019   MUCUS Present (A) 05/22/2019   BACTERIA None seen 05/22/2019    Pertinent Imaging: n/a No results found for this or any previous visit.  No results found for this or any previous visit.  No results found for this or any previous visit.  No results found for this or any previous visit.  No results found for this or any previous visit.  No results found for this or any previous visit.  No results found for this or any previous visit.  No results found for this or any previous visit.   Assessment &  Plan:    BPH, weak st - tamsulosin refilled  PSA elevation - PSA was sent. See in 6 mo for PSA and DRE.   No follow-ups on file.  Festus Aloe, MD  [facility] [address], [place], [place] [address] [phone]

## 2020-01-15 NOTE — Patient Instructions (Signed)

## 2020-01-16 LAB — PSA, TOTAL AND FREE
PSA, Free Pct: 19.3 %
PSA, Free: 1.12 ng/mL
Prostate Specific Ag, Serum: 5.8 ng/mL — ABNORMAL HIGH (ref 0.0–4.0)

## 2020-01-25 ENCOUNTER — Other Ambulatory Visit: Payer: Self-pay

## 2020-01-25 ENCOUNTER — Telehealth: Payer: Self-pay

## 2020-01-25 DIAGNOSIS — N400 Enlarged prostate without lower urinary tract symptoms: Secondary | ICD-10-CM

## 2020-01-25 NOTE — Telephone Encounter (Signed)
-----   Message from Festus Aloe, MD sent at 01/21/2020  7:26 PM EST ----- Let Malik Bridges know his PSA is stable - looks good. F/u as planned in 6 mo with a PSA prior for DRE. Thanks.  ----- Message ----- From: Chrystie Nose, CMA Sent: 01/18/2020   7:40 AM EST To: Festus Aloe, MD   ----- Message ----- From: Lavone Neri Lab Results In Sent: 01/16/2020   5:38 AM EST To: Rowe Robert Clinical

## 2020-01-25 NOTE — Telephone Encounter (Signed)
Notified patient as advised, patient verbalized understanding. Patient also did not have a lab appt for PSA scheduled prior to his appt in May. Lab appt scheduled and reminder mailed.

## 2020-07-13 ENCOUNTER — Other Ambulatory Visit: Payer: Medicare Other

## 2020-07-15 ENCOUNTER — Ambulatory Visit: Payer: Medicare Other | Admitting: Urology

## 2020-07-15 ENCOUNTER — Other Ambulatory Visit: Payer: Medicare HMO

## 2020-07-15 ENCOUNTER — Other Ambulatory Visit: Payer: Self-pay

## 2020-07-15 DIAGNOSIS — N400 Enlarged prostate without lower urinary tract symptoms: Secondary | ICD-10-CM

## 2020-07-16 LAB — PSA: Prostate Specific Ag, Serum: 6 ng/mL — ABNORMAL HIGH (ref 0.0–4.0)

## 2020-07-28 ENCOUNTER — Other Ambulatory Visit: Payer: Self-pay

## 2020-07-28 ENCOUNTER — Encounter: Payer: Self-pay | Admitting: Urology

## 2020-07-28 ENCOUNTER — Ambulatory Visit (INDEPENDENT_AMBULATORY_CARE_PROVIDER_SITE_OTHER): Payer: Medicare HMO | Admitting: Urology

## 2020-07-28 VITALS — BP 124/77 | HR 64 | Ht 64.0 in | Wt 152.0 lb

## 2020-07-28 DIAGNOSIS — N400 Enlarged prostate without lower urinary tract symptoms: Secondary | ICD-10-CM | POA: Diagnosis not present

## 2020-07-28 DIAGNOSIS — R972 Elevated prostate specific antigen [PSA]: Secondary | ICD-10-CM | POA: Diagnosis not present

## 2020-07-28 DIAGNOSIS — N401 Enlarged prostate with lower urinary tract symptoms: Secondary | ICD-10-CM

## 2020-07-28 LAB — URINALYSIS, COMPLETE
Bilirubin, UA: NEGATIVE
Ketones, UA: NEGATIVE
Leukocytes,UA: NEGATIVE
Nitrite, UA: NEGATIVE
Specific Gravity, UA: 1.025 (ref 1.005–1.030)
Urobilinogen, Ur: 2 mg/dL — ABNORMAL HIGH (ref 0.2–1.0)
pH, UA: 6 (ref 5.0–7.5)

## 2020-07-28 LAB — MICROSCOPIC EXAMINATION: Bacteria, UA: NONE SEEN

## 2020-07-28 LAB — BLADDER SCAN AMB NON-IMAGING: Scan Result: 0

## 2020-07-28 MED ORDER — MIRABEGRON ER 25 MG PO TB24: 25.0000 mg | ORAL_TABLET | Freq: Every day | ORAL | 0 refills | Status: DC

## 2020-07-28 NOTE — Progress Notes (Signed)
07/28/2020 9:08 AM   Malik Bridges 09/06/1950 [id number]  Referring provider: Kerri Perches, PA-C [place],  [place] [address]  Chief Complaint  Patient presents with  . Benign Prostatic Hypertrophy    Urologic history:  1.  Elevated PSA  Biopsy Dr. Junious Silk April 2021 PSA 6.7; prostate volume 93 g  Benign pathology  2.  BPH with LUTS  3.  Right spermatocelectomy/hydrocelectomy May 2021  HPI: 70 y.o. male presents for semiannual follow-up.   Since his last visit with Dr. Junious Silk November 2021 he has noted increased urinary frequency and nocturia several times per night  He has urgency but no urge incontinence  Remains on tamsulosin  PSA 07/15/2020 6.0     Denies dysuria, gross hematuria  Denies flank, abdominal or pelvic pain  IPSS today 18/35     PMH: Past Medical History:  Diagnosis Date  . Abdominal hernia   . Asthma   . Diabetes mellitus ([place])    Pt taking Insulin  . Elevated PSA 05/2019  . Enlarged prostate   . Hydrocele, bilateral 06/2019  . Hyperlipidemia   . Hypertension     Surgical History: Past Surgical History:  Procedure Laterality Date  . CIRCUMCISION    . COLONOSCOPY WITH PROPOFOL N/A 05/15/2018   Procedure: COLONOSCOPY WITH PROPOFOL;  Surgeon: Lin Landsman, MD;  Location: [facility] ENDOSCOPY;  Service: Gastroenterology;  Laterality: N/A;  . EYE SURGERY Bilateral    cataract extractions  . HYDROCELE EXCISION Right 07/21/2019   Procedure: HYDROCELECTOMY ADULT;  Surgeon: Abbie Sons, MD;  Location: [facility] ORS;  Service: Urology;  Laterality: Right;  . ROBOT ASSISTED INGUINAL HERNIA REPAIR Bilateral 05/01/2018   Procedure: ROBOT ASSISTED INGUINAL HERNIA REPAIR;  Surgeon: Jules Husbands, MD;  Location: [facility] ORS;  Service: General;  Laterality: Bilateral;  . UMBILICAL HERNIA REPAIR  05/01/2018   Procedure: HERNIA REPAIR UMBILICAL ADULT;  Surgeon: Jules Husbands, MD;  Location: [facility] ORS;  Service: General;;     Home Medications:  Allergies as of 07/28/2020      Reactions   Hydrochlorothiazide Other (See Comments)   K to 3.0 on Zestoretic 20-25      Medication List       Accurate as of Jul 28, 2020  9:08 AM. If you have any questions, ask your nurse or doctor.        Accu-Chek Guide Me w/Device Kit   Accu-Chek Softclix Lancets lancets SMARTSIG:Topical   albuterol 108 (90 Base) MCG/ACT inhaler Commonly known as: VENTOLIN HFA Inhale 2 puffs into the lungs every 4 (four) hours as needed for wheezing or shortness of breath.   atorvastatin 20 MG tablet Commonly known as: LIPITOR Take 20 mg by mouth daily.   Deluxe Tablet Cutter Misc Use to cut metformin tablets.   diclofenac Sodium 1 % Gel Commonly known as: VOLTAREN Apply topically.   Flovent HFA 110 MCG/ACT inhaler Generic drug: fluticasone Inhale 2 puffs into the lungs 2 (two) times daily.   glucose blood test strip Check twice a day, E11.42   insulin glargine 100 UNIT/ML injection Commonly known as: LANTUS Inject 30-60 Units into the skin See admin instructions. Inject 30 units in the morning and 60 units at night   Jardiance 10 MG Tabs tablet Generic drug: empagliflozin Take 10 mg by mouth daily.   lisinopril 20 MG tablet Commonly known as: ZESTRIL Take 20 mg by mouth daily.   metFORMIN 1000 MG tablet Commonly known as: GLUCOPHAGE Take 1,000 mg  by mouth 2 (two) times daily with a meal.   naproxen 500 MG tablet Commonly known as: NAPROSYN Take 500 mg by mouth 2 (two) times daily.   spironolactone 25 MG tablet Commonly known as: ALDACTONE Take 12.5 mg by mouth daily.   tamsulosin 0.4 MG Caps capsule Commonly known as: FLOMAX Take 1 capsule (0.4 mg total) by mouth daily.   traZODone 50 MG tablet Commonly known as: DESYREL Take 50 mg by mouth at bedtime.   TRUEplus Pen Needles 31G X 6 MM Misc Generic drug: Insulin Pen Needle       Allergies:  Allergies  Allergen Reactions  .  Hydrochlorothiazide Other (See Comments)    K to 3.0 on Zestoretic 20-25    Family History: History reviewed. No pertinent family history.  Social History:  reports that he has quit smoking. His smoking use included cigarettes. His smokeless tobacco use includes snuff. He reports previous alcohol use. He reports that he does not use drugs.   Physical Exam: BP 124/77   Pulse 64   Ht 5' 4" (1.626 m)   Wt 152 lb (68.9 kg)   BMI 26.09 kg/m   Constitutional:  Alert and oriented, No acute distress. HEENT: [place] AT, moist mucus membranes.  Trachea midline, no masses. Cardiovascular: No clubbing, cyanosis, or edema. Respiratory: Normal respiratory effort, no increased work of breathing. GI: Abdomen is soft, nontender, nondistended, no abdominal masses GU: Prostate 60+ grams, smooth without nodules Skin: No rashes, bruises or suspicious lesions. Neurologic: Grossly intact, no focal deficits, moving all 4 extremities. Psychiatric: Normal mood and affect.   Assessment & Plan:    1. BPH with obstruction/lower urinary tract symptoms  On tamsulosin  Bothersome frequency, urgency and nocturia  Add Myrbetriq 25 mg daily-samples given and he will call back regarding efficacy  2.  Elevated PSA  Previous benign biopsy  Stable PSA with benign DRE  Follow-up PSA 6 months with DRE 1 year   Abbie Sons, MD  [facility] [address], [place], [place] [address] [phone]

## 2020-07-29 ENCOUNTER — Ambulatory Visit: Payer: Medicare Other | Admitting: Urology

## 2020-09-06 ENCOUNTER — Ambulatory Visit (LOCAL_COMMUNITY_HEALTH_CENTER): Payer: Medicare Other

## 2020-09-06 ENCOUNTER — Other Ambulatory Visit: Payer: Self-pay

## 2020-09-06 DIAGNOSIS — Z111 Encounter for screening for respiratory tuberculosis: Secondary | ICD-10-CM

## 2020-09-06 NOTE — Progress Notes (Signed)
Patient in clinic today for QFT testing d/t family member with Active TB.  Would like HIV testing also.  Informed if negative will re test again in 8-10 weeks.Aileen Fass, RN

## 2020-09-07 ENCOUNTER — Other Ambulatory Visit: Payer: Self-pay

## 2020-09-09 LAB — QUANTIFERON-TB GOLD PLUS
QuantiFERON Mitogen Value: >10 IU/mL
QuantiFERON Nil Value: 0.08 IU/mL
QuantiFERON TB1 Ag Value: 0.04 IU/mL
QuantiFERON TB2 Ag Value: 0.04 IU/mL
QuantiFERON-TB Gold Plus: NEGATIVE

## 2020-09-09 LAB — HM HIV SCREENING LAB: HM HIV Screening: NEGATIVE

## 2020-09-19 ENCOUNTER — Telehealth: Payer: Self-pay | Admitting: *Deleted

## 2020-09-19 MED ORDER — MIRABEGRON ER 25 MG PO TB24: 25.0000 mg | ORAL_TABLET | Freq: Every day | ORAL | 11 refills | Status: DC

## 2020-09-19 NOTE — Telephone Encounter (Signed)
rx sent to pharmacy by e-script  

## 2020-12-26 ENCOUNTER — Other Ambulatory Visit: Payer: Medicare Other

## 2021-01-23 ENCOUNTER — Other Ambulatory Visit: Payer: Self-pay

## 2021-01-23 DIAGNOSIS — R972 Elevated prostate specific antigen [PSA]: Secondary | ICD-10-CM

## 2021-01-24 ENCOUNTER — Other Ambulatory Visit: Payer: Self-pay

## 2021-01-29 NOTE — Progress Notes (Deleted)
01/30/2021 1:10 PM   Malik Bridges 04-Jul-1950 [id number]  Referring provider: Kerri Perches, PA-C [place],  [place] [address]  No chief complaint on file.   Urologic history:   1.  Elevated PSA Biopsy Dr. Junious Silk April 2021 PSA 6.7; prostate volume 93 g Benign pathology   2.  BPH with LUTS   3.  Right spermatocelectomy/hydrocelectomy May 2021  HPI: 70 y.o. male presents for 64-monthfollow-up visit.  At visit May 2022 with bothersome storage related voiding symptoms and nocturia and Myrbetriq 25 mg added PSA at that visit stable at 6.0 Since last visit***    PMH: Past Medical History:  Diagnosis Date   Abdominal hernia    Asthma    Diabetes [place] ([place]    Pt taking Insulin   Elevated PSA 05/2019   Enlarged prostate    Hydrocele, bilateral 06/2019   Hyperlipidemia    Hypertension     Surgical History: Past Surgical History:  Procedure Laterality Date   CIRCUMCISION     COLONOSCOPY WITH PROPOFOL N/A 05/15/2018   Procedure: COLONOSCOPY WITH PROPOFOL;  Surgeon: VLin Landsman MD;  Location: [facility] ENDOSCOPY;  Service: Gastroenterology;  Laterality: N/A;   EYE SURGERY Bilateral    cataract extractions   HYDROCELE EXCISION Right 07/21/2019   Procedure: HYDROCELECTOMY ADULT;  Surgeon: SAbbie Sons MD;  Location: [facility] ORS;  Service: Urology;  Laterality: Right;   ROBOT ASSISTED INGUINAL HERNIA REPAIR Bilateral 05/01/2018   Procedure: ROBOT ASSISTED INGUINAL HERNIA REPAIR;  Surgeon: PJules Husbands MD;  Location: [facility] ORS;  Service: General;  Laterality: Bilateral;   UMBILICAL HERNIA REPAIR  05/01/2018   Procedure: HERNIA REPAIR UMBILICAL ADULT;  Surgeon: PJules Husbands MD;  Location: [facility] ORS;  Service: General;;    Home Medications:  Allergies as of 01/30/2021       Reactions   Hydrochlorothiazide Other (See Comments)   K to 3.0 on Zestoretic 20-25        Medication List        Accurate as of January 29, 2021  1:10  PM. If you have any questions, ask your nurse or doctor.          Accu-Chek Guide Me w/Device Kit   Accu-Chek Softclix Lancets lancets SMARTSIG:Topical   albuterol 108 (90 Base) MCG/ACT inhaler Commonly known as: VENTOLIN HFA Inhale 2 puffs into the lungs every 4 (four) hours as needed for wheezing or shortness of breath.   atorvastatin 20 MG tablet Commonly known as: LIPITOR Take 20 mg by mouth daily.   Deluxe Tablet Cutter Misc Use to cut metformin tablets.   diclofenac Sodium 1 % Gel Commonly known as: VOLTAREN Apply topically.   Flovent HFA 110 MCG/ACT inhaler Generic drug: fluticasone Inhale 2 puffs into the lungs 2 (two) times daily.   glucose blood test strip Check twice a day, E11.42   insulin glargine 100 UNIT/ML injection Commonly known as: LANTUS Inject 30-60 Units into the skin See admin instructions. Inject 30 units in the morning and 60 units at night   Jardiance 10 MG Tabs tablet Generic drug: empagliflozin Take 10 mg by mouth daily.   lisinopril 20 MG tablet Commonly known as: ZESTRIL Take 20 mg by mouth daily.   metFORMIN 1000 MG tablet Commonly known as: GLUCOPHAGE Take 1,000 mg by mouth 2 (two) times daily with a meal.   mirabegron ER 25 MG Tb24 tablet Commonly known as: MYRBETRIQ Take 1 tablet (25 mg total) by mouth daily.  naproxen 500 MG tablet Commonly known as: NAPROSYN Take 500 mg by mouth 2 (two) times daily.   spironolactone 25 MG tablet Commonly known as: ALDACTONE Take 12.5 mg by mouth daily.   tamsulosin 0.4 MG Caps capsule Commonly known as: FLOMAX Take 1 capsule (0.4 mg total) by mouth daily.   traZODone 50 MG tablet Commonly known as: DESYREL Take 50 mg by mouth at bedtime.   TRUEplus Pen Needles 31G X 6 MM Misc Generic drug: Insulin Pen Needle        Allergies:  Allergies  Allergen Reactions   Hydrochlorothiazide Other (See Comments)    K to 3.0 on Zestoretic 20-25    Family History: No family  history on file.  Social History:  reports that he has quit smoking. His smoking use included cigarettes. His smokeless tobacco use includes snuff. He reports that he does not currently use alcohol. He reports that he does not use drugs.   Physical Exam: There were no vitals taken for this visit.  Constitutional:  Alert and oriented, No acute distress. HEENT: [place] AT, moist mucus membranes.  Trachea midline, no masses. Cardiovascular: No clubbing, cyanosis, or edema. Respiratory: Normal respiratory effort, no increased work of breathing. GI: Abdomen is soft, nontender, nondistended, no abdominal masses GU: No CVA tenderness Skin: No rashes, bruises or suspicious lesions. Neurologic: Grossly intact, no focal deficits, moving all 4 extremities. Psychiatric: Normal mood and affect.  Laboratory Data: Lab Results  Component Value Date   WBC 4.3 07/17/2019   HGB 16.3 07/21/2019   HCT 48.0 07/21/2019   MCV 84.4 07/17/2019   PLT 183 07/17/2019    Lab Results  Component Value Date   CREATININE 0.70 07/21/2019    No results found for: PSA  No results found for: TESTOSTERONE  No results found for: HGBA1C  Urinalysis    Component Value Date/Time   APPEARANCEUR Hazy (A) 07/28/2020 0904   GLUCOSEU 2+ (A) 07/28/2020 0904   BILIRUBINUR Negative 07/28/2020 0904   PROTEINUR 2+ (A) 07/28/2020 0904   NITRITE Negative 07/28/2020 0904   LEUKOCYTESUR Negative 07/28/2020 0904    Lab Results  Component Value Date   LABMICR See below: 07/28/2020   WBCUA 0-5 07/28/2020   LABEPIT 0-10 07/28/2020   MUCUS Present (A) 07/28/2020   BACTERIA None seen 07/28/2020    Pertinent Imaging: *** No results found for this or any previous visit.  No results found for this or any previous visit.  No results found for this or any previous visit.  No results found for this or any previous visit.  No results found for this or any previous visit.  No results found for this or any previous  visit.  No results found for this or any previous visit.  No results found for this or any previous visit.   Assessment & Plan:    There are no diagnoses linked to this encounter.  No follow-ups on file.  Abbie Sons, [place] [address] [place], [place] [address] [phone]

## 2021-01-30 ENCOUNTER — Ambulatory Visit: Payer: Self-pay | Admitting: Urology

## 2021-01-30 ENCOUNTER — Ambulatory Visit (LOCAL_COMMUNITY_HEALTH_CENTER): Payer: Medicare Other

## 2021-01-30 ENCOUNTER — Encounter: Payer: Self-pay | Admitting: Urology

## 2021-01-30 DIAGNOSIS — Z111 Encounter for screening for respiratory tuberculosis: Secondary | ICD-10-CM

## 2021-01-31 NOTE — Progress Notes (Signed)
Patient was overdue for f/u QFT.  He is a close contact to an Active Tb patient.  Patient didn't want to come into the health dept for testing. Conducted homevisit and placed PPD.  Will go back out to the home on Thursday 02/02/21 for reading. Aileen Fass, RN   No charge PPD

## 2021-02-02 ENCOUNTER — Ambulatory Visit (LOCAL_COMMUNITY_HEALTH_CENTER): Payer: Self-pay

## 2021-02-02 DIAGNOSIS — Z111 Encounter for screening for respiratory tuberculosis: Secondary | ICD-10-CM

## 2021-02-02 LAB — TB SKIN TEST
Induration: 0 mm
TB Skin Test: NEGATIVE

## 2021-03-02 ENCOUNTER — Encounter: Payer: Self-pay | Admitting: Urology

## 2021-03-02 ENCOUNTER — Other Ambulatory Visit: Payer: Medicare Other

## 2021-03-10 ENCOUNTER — Ambulatory Visit (INDEPENDENT_AMBULATORY_CARE_PROVIDER_SITE_OTHER): Payer: Medicare HMO | Admitting: Urology

## 2021-03-10 ENCOUNTER — Other Ambulatory Visit: Payer: Self-pay

## 2021-03-10 ENCOUNTER — Encounter: Payer: Self-pay | Admitting: Urology

## 2021-03-10 VITALS — BP 163/79 | HR 64 | Ht 64.0 in | Wt 156.0 lb

## 2021-03-10 DIAGNOSIS — N400 Enlarged prostate without lower urinary tract symptoms: Secondary | ICD-10-CM

## 2021-03-10 DIAGNOSIS — R972 Elevated prostate specific antigen [PSA]: Secondary | ICD-10-CM

## 2021-03-10 LAB — URINALYSIS, COMPLETE
Bilirubin, UA: NEGATIVE
Ketones, UA: NEGATIVE
Leukocytes,UA: NEGATIVE
Nitrite, UA: NEGATIVE
Specific Gravity, UA: 1.015 (ref 1.005–1.030)
Urobilinogen, Ur: 2 mg/dL — ABNORMAL HIGH (ref 0.2–1.0)
pH, UA: 6.5 (ref 5.0–7.5)

## 2021-03-10 LAB — MICROSCOPIC EXAMINATION: Bacteria, UA: NONE SEEN

## 2021-03-10 LAB — BLADDER SCAN AMB NON-IMAGING: Scan Result: 0

## 2021-03-10 NOTE — Progress Notes (Signed)
03/10/2021 10:19 AM   Malik Bridges May 02, 1950 [id number]  Referring provider: Kerri Perches, PA-C [place],  [place] [address]  Chief Complaint  Patient presents with   Benign Prostatic Hypertrophy    Urologic history:   1.  Elevated PSA Biopsy Dr. Junious Silk April 2021 PSA 6.7; prostate volume 93 g Benign pathology   2.  BPH with LUTS   3.  Right spermatocelectomy/hydrocelectomy May 2021  HPI: 71 y.o. male presents for follow-up.  Last seen May 2022 and was having bothersome frequency, urgency and nocturia Given Myrbetriq samples and noted mild improvement Complains of urinary hesitancy and sensation of a restricted flow No dysuria or gross hematuria   PMH: Past Medical History:  Diagnosis Date   Abdominal hernia    Asthma    Diabetes mellitus ([place])    Pt taking Insulin   Elevated PSA 05/2019   Enlarged prostate    Hydrocele, bilateral 06/2019   Hyperlipidemia    Hypertension     Surgical History: Past Surgical History:  Procedure Laterality Date   CIRCUMCISION     COLONOSCOPY WITH PROPOFOL N/A 05/15/2018   Procedure: COLONOSCOPY WITH PROPOFOL;  Surgeon: Lin Landsman, MD;  Location: [facility] END[facility]COPY;  Service: Gastroenterology;  Laterality: N/A;   EYE SURGERY Bilateral    cataract extractions   HYDROCELE EXCISION Right 07/21/2019   Procedure: HYDROCELECTOMY ADULT;  Surgeon: Abbie Sons, MD;  Location: [facility] ORS;  Service: Urology;  Laterality: Right;   ROBOT ASSISTED INGUINAL HERNIA REPAIR Bilateral 05/01/2018   Procedure: ROBOT ASSISTED INGUINAL HERNIA REPAIR;  Surgeon: Jules Husbands, MD;  Location: [facility] ORS;  Service: General;  Laterality: Bilateral;   UMBILICAL HERNIA REPAIR  05/01/2018   Procedure: HERNIA REPAIR UMBILICAL ADULT;  Surgeon: Jules Husbands, MD;  Location: [facility] ORS;  Service: General;;    Home Medications:  Allergies as of 03/10/2021       Reactions   Hydrochlorothiazide Other (See Comments)   K to 3.0 on  Zestoretic 20-25        Medication List        Accurate as of March 10, 2021 10:19 AM. If you have any questions, ask your nurse or doctor.          Accu-Chek Guide Me w/Device Kit   Accu-Chek Softclix Lancets lancets SMARTSIG:Topical   albuterol 108 (90 Base) MCG/ACT inhaler Commonly known as: VENTOLIN HFA Inhale 2 puffs into the lungs every 4 (four) hours as needed for wheezing or shortness of breath.   atorvastatin 20 MG tablet Commonly known as: LIPITOR Take 20 mg by mouth daily.   Deluxe Tablet Cutter Misc Use to cut metformin tablets.   diclofenac Sodium 1 % Gel Commonly known as: VOLTAREN Apply topically.   Flovent HFA 110 MCG/ACT inhaler Generic drug: fluticasone Inhale 2 puffs into the lungs 2 (two) times daily.   glucose blood test strip Check twice a day, E11.42   insulin glargine 100 UNIT/ML injection Commonly known as: LANTUS Inject 30-60 Units into the skin See admin instructions. Inject 30 units in the morning and 60 units at night   Jardiance 10 MG Tabs tablet Generic drug: empagliflozin Take 10 mg by mouth daily.   lisinopril 20 MG tablet Commonly known as: ZESTRIL Take 20 mg by mouth daily.   metFORMIN 1000 MG tablet Commonly known as: GLUCOPHAGE Take 1,000 mg by mouth 2 (two) times daily with a meal.   mirabegron ER 25 MG Tb24 tablet Commonly known as:  MYRBETRIQ Take 1 tablet (25 mg total) by mouth daily.   naproxen 500 MG tablet Commonly known as: NAPROSYN Take 500 mg by mouth 2 (two) times daily.   spironolactone 25 MG tablet Commonly known as: ALDACTONE Take 12.5 mg by mouth daily.   tamsulosin 0.4 MG Caps capsule Commonly known as: FLOMAX Take 1 capsule (0.4 mg total) by mouth daily.   traZODone 50 MG tablet Commonly known as: DESYREL Take 50 mg by mouth at bedtime.   TRUEplus Pen Needles 31G X 6 MM Misc Generic drug: Insulin Pen Needle        Allergies:  Allergies  Allergen Reactions   Hydrochlorothiazide  Other (See Comments)    K to 3.0 on Zestoretic 20-25    Family History: History reviewed. No pertinent family history.  Social History:  reports that he has quit smoking. His smoking use included cigarettes. His smokeless tobacco use includes snuff. He reports that he does not currently use alcohol. He reports that he does not use drugs.   Physical Exam: BP (!) 163/79    Pulse 64    Ht '5\' 4"'  (1.626 m)    Wt 156 lb (70.8 kg)    BMI 26.78 kg/m   Constitutional:  Alert, No acute distress. HEENT: [place] AT, moist mucus membranes.  Trachea midline, no masses. Cardiovascular: No clubbing, cyanosis, or edema. Respiratory: Normal respiratory effort, no increased work of breathing. GI: Abdomen is soft, nontender, nondistended, no abdominal masses GU: No CVA tenderness Skin: No rashes, bruises or suspicious lesions. Neurologic: Grossly intact, no focal deficits, moving all 4 extremities. Psychiatric: Normal mood and affect.  Laboratory Data:  Urinalysis Dipstick/microscopy negative   Assessment & Plan:    1.  BPH with LUTS Bladder scan PVR 0 mL No significant improvement on Myrbetriq; remains on tamsulosin with bothersome LUTS Schedule cystoscopy  2.  Elevated PSA Recheck PSA today   Abbie Sons, MD  [facility] [address] [place], [place] [address] [phone]

## 2021-03-11 LAB — PSA: Prostate Specific Ag, Serum: 6.7 ng/mL — ABNORMAL HIGH (ref 0.0–4.0)

## 2021-04-13 ENCOUNTER — Encounter: Payer: Self-pay | Admitting: Urology

## 2021-04-13 ENCOUNTER — Other Ambulatory Visit: Payer: Self-pay

## 2021-04-13 ENCOUNTER — Ambulatory Visit: Payer: Medicare HMO | Admitting: Urology

## 2021-04-13 VITALS — BP 163/83 | HR 69 | Ht 68.0 in | Wt 156.0 lb

## 2021-04-13 DIAGNOSIS — N401 Enlarged prostate with lower urinary tract symptoms: Secondary | ICD-10-CM | POA: Diagnosis not present

## 2021-04-13 DIAGNOSIS — N400 Enlarged prostate without lower urinary tract symptoms: Secondary | ICD-10-CM

## 2021-04-13 LAB — URINALYSIS, COMPLETE
Bilirubin, UA: NEGATIVE
Ketones, UA: NEGATIVE
Leukocytes,UA: NEGATIVE
Nitrite, UA: NEGATIVE
Specific Gravity, UA: 1.015 (ref 1.005–1.030)
Urobilinogen, Ur: 0.2 mg/dL (ref 0.2–1.0)
pH, UA: 7 (ref 5.0–7.5)

## 2021-04-13 LAB — MICROSCOPIC EXAMINATION: Bacteria, UA: NONE SEEN

## 2021-04-13 NOTE — Progress Notes (Signed)
° °  04/13/21  CC:  Chief Complaint  Patient presents with   Cysto    Urologic history:   1.  Elevated PSA Biopsy Dr. Junious Silk April 2021 PSA 6.7; prostate volume 93 g Benign pathology   2.  BPH with LUTS   3.  Right spermatocelectomy/hydrocelectomy May 2021  HPI: 71 y.o. male with bothersome LUTS.  93 g prostate on TRUS 2021.  Bothersome symptoms on tamsulosin and Myrbetriq.  IPSS 03/10/2021 was 33/35; bladder scan PVR 0 mL  See rooming tab for vitals NED. A&Ox3.   No respiratory distress   Abd soft, NT, ND Normal phallus with bilateral descended testicles  Cystoscopy Procedure Note  Patient identification was confirmed, informed consent was obtained, and patient was prepped using Betadine solution.  Lidocaine jelly was administered per urethral meatus.     Pre-Procedure: - Inspection reveals a normal caliber urethral meatus.  Procedure: The flexible cystoscope was introduced without difficulty - No urethral strictures/lesions are present. -  Coapting lateral lobes  prostate with hypervascularity/friability - Elevated bladder neck - Bilateral ureteral orifices identified - Bladder mucosa  reveals no ulcers, tumors, or lesions - No bladder stones -Moderate trabeculation  Retroflexion shows backbleeding from prostate-suboptimal visualization   Post-Procedure: - Patient tolerated the procedure well  Assessment/ Plan: BPH with severe LUTS which have worsened Prostate volume close to 100 g Outlet procedures were discussed and feel HoLEP would be his best option The procedure was discussed including the possibility his storage related voiding symptoms may not improve. Discussed with Dr. Edwyna Ready schedule and he will see him for a preoperative visit      Abbie Sons, MD

## 2021-04-16 ENCOUNTER — Other Ambulatory Visit: Payer: Self-pay | Admitting: Urology

## 2021-04-16 ENCOUNTER — Encounter: Payer: Self-pay | Admitting: Urology

## 2021-04-16 DIAGNOSIS — N401 Enlarged prostate with lower urinary tract symptoms: Secondary | ICD-10-CM

## 2021-04-16 NOTE — Progress Notes (Signed)
Surgical Physician Order Form [facility] [place]  * Scheduling expectation : Next Available-Dr. Diamantina Providence  *Length of Case: Standard HoLEP  *Clearance needed: no  *Anticoagulation Instructions: N/A  *Aspirin Instructions: N/A  *Post-op visit Date/Instructions:   3-day voiding trial  *Diagnosis: BPH w/BOO  *Procedure:  HOLEP (53005)   Additional orders: N/A  -Admit type: OUTpatient  -Anesthesia: Choice  -VTE Prophylaxis Standing Order SCDs       Other:   -Standing Lab Orders Per Anesthesia    Lab other: UA&Urine Culture  -Standing Test orders EKG/Chest x-ray per Anesthesia       Test other:   - Medications:  Ancef 2gm IV  -Other orders:  N/A

## 2021-04-19 ENCOUNTER — Other Ambulatory Visit: Payer: Self-pay | Admitting: Family Medicine

## 2021-04-19 MED ORDER — TAMSULOSIN HCL 0.4 MG PO CAPS: 0.4000 mg | ORAL_CAPSULE | Freq: Every day | ORAL | 3 refills | Status: DC

## 2021-04-21 ENCOUNTER — Telehealth: Payer: Self-pay

## 2021-04-21 NOTE — Telephone Encounter (Signed)
I spoke with [name]. We have discussed possible surgery dates and Friday March 10th, 2023 was agreed upon by all parties. Patient given information about surgery date, what to expect pre-operatively and post operatively.  We discussed that a Pre-Admission Testing office will be calling to set up the pre-op visit that will take place prior to surgery, and that these appointments are typically done over the phone with a Pre-Admissions RN.   Informed patient that our office will communicate any additional care to be provided after surgery. Patients questions or concerns were discussed during our call. Advised to call our office should there be any additional information, questions or concerns that arise. Patient verbalized understanding.

## 2021-04-21 NOTE — Progress Notes (Signed)
[facility] Posting Form   Surgery Date/Time: Date: 05/12/2021  Surgeon: Dr. 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

## 2021-04-25 ENCOUNTER — Telehealth: Payer: Self-pay | Admitting: Urology

## 2021-04-25 NOTE — Telephone Encounter (Signed)
Pts wife would like a callback in regards to his upcoming procedure. Informed her she may get her call by end of day or tomorrow. Her number is [phone].

## 2021-04-25 NOTE — Telephone Encounter (Signed)
Called pt's wife per DPR. No answer. 1st attempt.

## 2021-04-26 ENCOUNTER — Ambulatory Visit (INDEPENDENT_AMBULATORY_CARE_PROVIDER_SITE_OTHER): Payer: Medicare Other | Admitting: Urology

## 2021-04-26 ENCOUNTER — Encounter: Payer: Self-pay | Admitting: Urology

## 2021-04-26 ENCOUNTER — Other Ambulatory Visit: Payer: Self-pay

## 2021-04-26 DIAGNOSIS — N401 Enlarged prostate with lower urinary tract symptoms: Secondary | ICD-10-CM

## 2021-04-26 NOTE — Telephone Encounter (Signed)
Pt scheduled for in person consult today.

## 2021-04-26 NOTE — Patient Instructions (Signed)

## 2021-04-26 NOTE — Progress Notes (Signed)
° °  04/26/2021 3:09 PM   [name] [birth year] [id number]  Reason for visit: Follow up BPH, discuss HOLEP  HPI: 71 year old male who has been followed by Dr. Junious Silk and Dr. Bernardo Heater for BPH, elevated PSA, and urinary symptoms.  PSA has been mildly elevated at 5-6.7, and he underwent a negative biopsy he recently underwent work-up for consideration of an outlet procedure with Dr. Bernardo Heater and [place] in 2021 showed a 93 g prostate, and cystoscopy recently showed lateral lobe hypertrophy with a high bladder neck, no suspicious bladder lesions with moderate trabeculation.  He was referred for discussion of HOLEP.  His urinary symptoms are primarily weak stream, feeling of incomplete emptying, urgency, and frequency.  We discussed the risks and benefits of HoLEP at length.  The procedure requires general anesthesia and takes 1 to 2 hours, and a holmium laser is used to enucleate the prostate and push this tissue into the bladder.  A morcellator is then used to remove this tissue, which is sent for pathology.  The vast majority(>95%) of patients are able to discharge the same day with a catheter in place for 2 to 3 days, and will follow-up in clinic for a voiding trial.  We specifically discussed the risks of bleeding, infection, retrograde ejaculation, temporary urgency and urge incontinence, very low risk of long-term incontinence, urethral stricture/bladder neck contracture, pathologic evaluation of prostate tissue and possible detection of prostate cancer or other malignancy, and possible need for additional procedures.  -Proceed with HOLEP as scheduled -We discussed his mixed voiding and storage symptoms, and that he may need to continue OAB medications postoperatively   Billey Co, MD  [facility] [address] [place], [place] [address] [phone]

## 2021-04-27 LAB — URINALYSIS, COMPLETE
Bilirubin, UA: NEGATIVE
Leukocytes,UA: NEGATIVE
Nitrite, UA: NEGATIVE
RBC, UA: NEGATIVE
Specific Gravity, UA: 1.015 (ref 1.005–1.030)
Urobilinogen, Ur: 4 mg/dL — ABNORMAL HIGH (ref 0.2–1.0)
pH, UA: 6.5 (ref 5.0–7.5)

## 2021-04-27 LAB — MICROSCOPIC EXAMINATION
Bacteria, UA: NONE SEEN
Epithelial Cells (non renal): NONE SEEN /hpf (ref 0–10)

## 2021-04-29 LAB — CULTURE, URINE COMPREHENSIVE

## 2021-05-01 ENCOUNTER — Other Ambulatory Visit: Payer: Medicare Other

## 2021-05-05 ENCOUNTER — Other Ambulatory Visit: Payer: Self-pay

## 2021-05-05 ENCOUNTER — Encounter
Admission: RE | Admit: 2021-05-05 | Discharge: 2021-05-05 | Disposition: A | Discharge status: Home / Self Care | Payer: Medicare Other | Source: Ambulatory Visit | Attending: Urology | Admitting: Urology

## 2021-05-05 DIAGNOSIS — E119 Type 2 diabetes mellitus without complications: Secondary | ICD-10-CM

## 2021-05-05 DIAGNOSIS — Z794 Long term (current) use of insulin: Secondary | ICD-10-CM

## 2021-05-05 NOTE — Patient Instructions (Addendum)
Your procedure is scheduled on: 05/12/2021, Friday  ?Report to the Registration Desk on the 1st floor of the [facility]. ?To find out your arrival time, please call same day surgery floor [phone]) [phone] between 1PM - 3PM on: 05/11/2021,Thursday ? ?REMEMBER: ?Instructions that are not followed completely may result in serious medical risk, up to and including death; or upon the discretion of your surgeon and anesthesiologist your surgery may need to be rescheduled. ? ?Do not eat food after midnight the night before surgery.  ?No gum chewing, lozengers or hard candies.  ? ? ?TAKE THESE MEDICATIONS THE MORNING OF SURGERY WITH A SIP OF WATER: ?Flomax ?mirabegron ER (MYRBETRIQ)  ?    Do not take medications not on the list  ? ?Please remember the date/day of the last dose for each medication on your list. The pre-op RN will ask you these dates.  ? ? ?Use inhalers on the day of surgery and bring to the hospital. ? ?**Follow new guidelines for insulin and diabetes medications.** ? ?Do not  take metFORMIN (GLUCOPHAGE) on March 8 and May 11, 2021 . ? ?Do not take empagliflozin (JARDIANCE) on March 7, 8 and May 11, 2021  ? ?Inject half dose only of your insulin lantus the night before surgery to avoid low blood sugar on surgery day. ? ?Do not take any insulin the morning of surgery to avoid low blood sugar  ?  ? ? ?One week prior to surgery: ? ?Stop Anti-inflammatories (NSAIDS) such as Advil, Aleve, Ibuprofen, Motrin, Naproxen, Naprosyn and Aspirin based products such as Excedrin, Goodys Powder, BC Powder. ? ?Stop ANY OVER THE COUNTER supplements until after surgery. ?You may however, continue to take Tylenol if needed for pain up until the day of surgery. ? ?No Alcohol for 24 hours before or after surgery. ? ?No Smoking including e-cigarettes for 24 hours prior to surgery.  ?No chewable tobacco products for at least 6 hours prior to surgery.  ?No nicotine patches on the day of surgery. ? ?Do not use any "recreational" drugs  for at least a week prior to your surgery.  ?Please be advised that the combination of cocaine and anesthesia may have negative outcomes, up to and including death. ?If you test positive for cocaine, your surgery will be cancelled. ? ?On the morning of surgery brush your teeth with toothpaste and water, you may rinse your mouth with mouthwash if you wish. ?Do not swallow any toothpaste or mouthwash. ? ?Use CHG Soap or wipes as directed on instruction sheet.-provided for you ? ?Do not wear jewelry, make-up, hairpins, clips or nail polish. ? ?Do not wear lotions, powders, or perfumes.  ? ?Do not shave body from the neck down 48 hours prior to surgery just in case you cut yourself which could leave a site for infection.  ?Also, freshly shaved skin may become irritated if using the CHG soap. ? ?Contact lenses, hearing aids and dentures may not be worn into surgery. ? ?Do not bring valuables to the hospital. [facility] is not responsible for any missing/lost belongings or valuables.  ? ? ?Notify your doctor if there is any change in your medical condition (cold, fever, infection). ? ?Wear comfortable clothing (specific to your surgery type) to the hospital. ? ?After surgery, you can help prevent lung complications by doing breathing exercises.  ?Take deep breaths and cough every 1-2 hours. Your doctor may order a device called an Incentive Spirometer to help you take deep breaths. ? ?If you are being  admitted to the hospital overnight, leave your suitcase in the car. ?After surgery it may be brought to your room. ? ?If you are being discharged the day of surgery, you will not be allowed to drive home. ?You will need a responsible adult (18 years or older) to drive you home and stay with you that night.  ? ?If you are taking public transportation, you will need to have a responsible adult (18 years or older) with you. ?Please confirm with your physician that it is acceptable to use public transportation.  ? ?Please call  the [facility]. at [phone] if you have any questions about these instructions. ? ?Surgery Visitation Policy: ? ?Patients undergoing a surgery or procedure may have one family member or support person with them as long as that person is not COVID-19 positive or experiencing its symptoms.  ?That person may remain in the waiting area during the procedure and may rotate out with other people. ? ?Inpatient Visitation:  If you are staying overnight only! ? ?Visiting hours are 7 a.m. to 8 p.m. ?Up to two visitors ages 16+ are allowed at one time in a patient room. The visitors may rotate out with other people during the day. Visitors must check out when they leave, or other visitors will not be allowed. One designated support person may remain overnight. ?The visitor must pass COVID-19 screenings, use hand sanitizer when entering and exiting the patient?s room and wear a mask at all times, including in the patient?s room. ?Patients must also wear a mask when staff or their visitor are in the room. ?Masking is required regardless of vaccination status.  ?

## 2021-05-05 NOTE — Pre-Procedure Instructions (Signed)
Unable to discuss patient's PTA meds over the phone and his patient instructions due to patient's   ?difficulty. Therefore, since patient is coming for lab work on Monday,05/08/2021, I advised patient that a nurse will go over the PTA medications and patient instructions with him and his wife on this day. Patient will bring his medications on the lab day. I left a note to [place], front desk staff to accommodate this. ?

## 2021-05-08 ENCOUNTER — Encounter
Admission: RE | Admit: 2021-05-08 | Discharge: 2021-05-08 | Disposition: A | Discharge status: Home / Self Care | Payer: Medicare Other | Source: Ambulatory Visit | Attending: Urology | Admitting: Urology

## 2021-05-08 ENCOUNTER — Other Ambulatory Visit: Payer: Self-pay

## 2021-05-08 ENCOUNTER — Encounter: Payer: Self-pay | Admitting: Urgent Care

## 2021-05-08 DIAGNOSIS — Z01818 Encounter for other preprocedural examination: Secondary | ICD-10-CM | POA: Diagnosis not present

## 2021-05-08 DIAGNOSIS — Z794 Long term (current) use of insulin: Secondary | ICD-10-CM

## 2021-05-08 DIAGNOSIS — E119 Type 2 diabetes mellitus without complications: Secondary | ICD-10-CM | POA: Diagnosis not present

## 2021-05-08 LAB — BASIC METABOLIC PANEL
Anion gap: 6 (ref 5–15)
BUN: 10 mg/dL (ref 8–23)
CO2: 27 mmol/L (ref 22–32)
Calcium: 8.7 mg/dL — ABNORMAL LOW (ref 8.9–10.3)
Chloride: 106 mmol/L (ref 98–111)
Creatinine, Ser: 0.74 mg/dL (ref 0.61–1.24)
GFR, Estimated: >60 mL/min (ref >60)
Glucose, Bld: 100 mg/dL — ABNORMAL HIGH (ref 70–99)
Potassium: 3.2 mmol/L — ABNORMAL LOW (ref 3.5–5.1)
Sodium: 139 mmol/L (ref 135–145)

## 2021-05-08 LAB — CBC
HCT: 47.8 % (ref 39.0–52.0)
Hemoglobin: 15.6 g/dL (ref 13.0–17.0)
MCH: 27.8 pg (ref 26.0–34.0)
MCHC: 32.6 g/dL (ref 30.0–36.0)
MCV: 85.1 fL (ref 80.0–100.0)
Platelets: 220 10*3/uL (ref 150–400)
RBC: 5.62 MIL/uL (ref 4.22–5.81)
RDW: 12.9 % (ref 11.5–15.5)
WBC: 3.6 10*3/uL — ABNORMAL LOW (ref 4.0–10.5)
nRBC: 0 % (ref 0.0–0.2)

## 2021-05-08 NOTE — Progress Notes (Signed)
Reviewed patient's instructions with the patient and his wife concerning their meds and the surgery. They understood the instructions and will call if they have any questions. ?

## 2021-05-12 ENCOUNTER — Encounter: Payer: Self-pay | Admitting: Urology

## 2021-05-12 ENCOUNTER — Ambulatory Visit: Payer: Medicare Other | Admitting: Anesthesiology

## 2021-05-12 ENCOUNTER — Other Ambulatory Visit: Payer: Self-pay

## 2021-05-12 ENCOUNTER — Ambulatory Visit
Admission: RE | Admit: 2021-05-12 | Discharge: 2021-05-12 | Disposition: A | Discharge status: Home / Self Care | Payer: Medicare Other | Attending: Urology | Admitting: Urology

## 2021-05-12 ENCOUNTER — Encounter
Admission: RE | Disposition: A | Discharge status: Home / Self Care | Payer: Self-pay | Source: Home / Self Care | Attending: Urology

## 2021-05-12 DIAGNOSIS — R3912 Poor urinary stream: Secondary | ICD-10-CM | POA: Insufficient documentation

## 2021-05-12 DIAGNOSIS — N138 Other obstructive and reflux uropathy: Secondary | ICD-10-CM | POA: Diagnosis not present

## 2021-05-12 DIAGNOSIS — N401 Enlarged prostate with lower urinary tract symptoms: Secondary | ICD-10-CM | POA: Insufficient documentation

## 2021-05-12 DIAGNOSIS — I10 Essential (primary) hypertension: Secondary | ICD-10-CM | POA: Insufficient documentation

## 2021-05-12 DIAGNOSIS — R3914 Feeling of incomplete bladder emptying: Secondary | ICD-10-CM | POA: Insufficient documentation

## 2021-05-12 DIAGNOSIS — N320 Bladder-neck obstruction: Secondary | ICD-10-CM | POA: Insufficient documentation

## 2021-05-12 DIAGNOSIS — Z87891 Personal history of nicotine dependence: Secondary | ICD-10-CM | POA: Insufficient documentation

## 2021-05-12 DIAGNOSIS — E119 Type 2 diabetes mellitus without complications: Secondary | ICD-10-CM | POA: Insufficient documentation

## 2021-05-12 DIAGNOSIS — R3915 Urgency of urination: Secondary | ICD-10-CM | POA: Insufficient documentation

## 2021-05-12 DIAGNOSIS — Z794 Long term (current) use of insulin: Secondary | ICD-10-CM | POA: Insufficient documentation

## 2021-05-12 DIAGNOSIS — R350 Frequency of micturition: Secondary | ICD-10-CM | POA: Insufficient documentation

## 2021-05-12 HISTORY — PX: HOLEP-LASER ENUCLEATION OF THE PROSTATE WITH MORCELLATION: SHX6641

## 2021-05-12 LAB — GLUCOSE, CAPILLARY
Glucose-Capillary: 157 mg/dL — ABNORMAL HIGH (ref 70–99)
Glucose-Capillary: 161 mg/dL — ABNORMAL HIGH (ref 70–99)

## 2021-05-12 SURGERY — ENUCLEATION, PROSTATE, USING LASER, WITH MORCELLATION
Anesthesia: General

## 2021-05-12 MED ORDER — CEFAZOLIN SODIUM-DEXTROSE 2-4 GM/100ML-% IV SOLN
INTRAVENOUS | Status: AC
  Filled 2021-05-12: qty 100

## 2021-05-12 MED ORDER — FENTANYL CITRATE (PF) 100 MCG/2ML IJ SOLN: 25.0000 ug | INTRAMUSCULAR | Status: DC | PRN

## 2021-05-12 MED ORDER — ONDANSETRON HCL 4 MG/2ML IJ SOLN: 4.0000 mg | Freq: Once | INTRAMUSCULAR | Status: DC | PRN

## 2021-05-12 MED ORDER — FENTANYL CITRATE (PF) 100 MCG/2ML IJ SOLN
INTRAMUSCULAR | Status: AC
  Administered 2021-05-12: 25 ug via INTRAVENOUS
  Filled 2021-05-12: qty 2

## 2021-05-12 MED ORDER — FENTANYL CITRATE (PF) 100 MCG/2ML IJ SOLN
INTRAMUSCULAR | Status: DC | PRN
  Administered 2021-05-12: 100 ug via INTRAVENOUS
  Administered 2021-05-12: 25 ug via INTRAVENOUS

## 2021-05-12 MED ORDER — ACETAMINOPHEN 10 MG/ML IV SOLN: 1000.0000 mg | Freq: Once | INTRAVENOUS | Status: DC | PRN

## 2021-05-12 MED ORDER — ROCURONIUM BROMIDE 100 MG/10ML IV SOLN
INTRAVENOUS | Status: DC | PRN
  Administered 2021-05-12: 50 mg via INTRAVENOUS

## 2021-05-12 MED ORDER — PHENYLEPHRINE HCL-NACL 20-0.9 MG/250ML-% IV SOLN
INTRAVENOUS | Status: AC
  Filled 2021-05-12: qty 250

## 2021-05-12 MED ORDER — OXYCODONE HCL 5 MG PO TABS: 5.0000 mg | ORAL_TABLET | Freq: Once | ORAL | Status: DC | PRN

## 2021-05-12 MED ORDER — ONDANSETRON HCL 4 MG/2ML IJ SOLN
INTRAMUSCULAR | Status: AC
  Filled 2021-05-12: qty 2

## 2021-05-12 MED ORDER — FENTANYL CITRATE (PF) 100 MCG/2ML IJ SOLN
INTRAMUSCULAR | Status: AC
  Filled 2021-05-12: qty 2

## 2021-05-12 MED ORDER — OXYCODONE HCL 5 MG/5ML PO SOLN: 5.0000 mg | Freq: Once | ORAL | Status: DC | PRN

## 2021-05-12 MED ORDER — SODIUM CHLORIDE 0.9 % IR SOLN
Status: DC | PRN
  Administered 2021-05-12: 30000 mL via INTRAVESICAL

## 2021-05-12 MED ORDER — DEXAMETHASONE SODIUM PHOSPHATE 10 MG/ML IJ SOLN
INTRAMUSCULAR | Status: DC | PRN
  Administered 2021-05-12: 5 mg via INTRAVENOUS

## 2021-05-12 MED ORDER — DEXAMETHASONE SODIUM PHOSPHATE 10 MG/ML IJ SOLN
INTRAMUSCULAR | Status: AC
  Filled 2021-05-12: qty 1

## 2021-05-12 MED ORDER — HYDROCODONE-ACETAMINOPHEN 5-325 MG PO TABS: 1.0000 | ORAL_TABLET | Freq: Four times a day (QID) | ORAL | 0 refills | Status: AC | PRN

## 2021-05-12 MED ORDER — SUGAMMADEX SODIUM 200 MG/2ML IV SOLN
INTRAVENOUS | Status: DC | PRN
  Administered 2021-05-12: 300 mg via INTRAVENOUS

## 2021-05-12 MED ORDER — FAMOTIDINE 20 MG PO TABS
ORAL_TABLET | ORAL | Status: AC
  Administered 2021-05-12: 20 mg via ORAL
  Filled 2021-05-12: qty 1

## 2021-05-12 MED ORDER — PROPOFOL 10 MG/ML IV BOLUS
INTRAVENOUS | Status: AC
  Filled 2021-05-12: qty 20

## 2021-05-12 MED ORDER — CHLORHEXIDINE GLUCONATE 0.12 % MT SOLN
OROMUCOSAL | Status: AC
  Administered 2021-05-12: 15 mL
  Filled 2021-05-12: qty 15

## 2021-05-12 MED ORDER — LIDOCAINE HCL (PF) 2 % IJ SOLN
INTRAMUSCULAR | Status: AC
  Filled 2021-05-12: qty 5

## 2021-05-12 MED ORDER — ACETAMINOPHEN 10 MG/ML IV SOLN
INTRAVENOUS | Status: AC
  Filled 2021-05-12: qty 100

## 2021-05-12 MED ORDER — FENTANYL CITRATE (PF) 100 MCG/2ML IJ SOLN
INTRAMUSCULAR | Status: AC
Start: 2021-05-12 — End: ?
  Filled 2021-05-12: qty 2

## 2021-05-12 MED ORDER — PHENYLEPHRINE 40 MCG/ML (10ML) SYRINGE FOR IV PUSH (FOR BLOOD PRESSURE SUPPORT)
PREFILLED_SYRINGE | INTRAVENOUS | Status: DC | PRN
  Administered 2021-05-12: 160 ug via INTRAVENOUS

## 2021-05-12 MED ORDER — ACETAMINOPHEN 10 MG/ML IV SOLN
INTRAVENOUS | Status: DC | PRN
  Administered 2021-05-12: 1000 mg via INTRAVENOUS

## 2021-05-12 MED ORDER — PROPOFOL 10 MG/ML IV BOLUS
INTRAVENOUS | Status: DC | PRN
  Administered 2021-05-12: 140 mg via INTRAVENOUS
  Administered 2021-05-12: 60 mg via INTRAVENOUS

## 2021-05-12 MED ORDER — LIDOCAINE HCL (CARDIAC) PF 100 MG/5ML IV SOSY
PREFILLED_SYRINGE | INTRAVENOUS | Status: DC | PRN
  Administered 2021-05-12: 80 mg via INTRAVENOUS

## 2021-05-12 MED ORDER — FAMOTIDINE 20 MG PO TABS: 20.0000 mg | ORAL_TABLET | Freq: Once | ORAL | Status: AC

## 2021-05-12 MED ORDER — CEFAZOLIN SODIUM-DEXTROSE 2-4 GM/100ML-% IV SOLN
2.0000 g | INTRAVENOUS | Status: AC
  Administered 2021-05-12: 2 g via INTRAVENOUS

## 2021-05-12 MED ORDER — SODIUM CHLORIDE 0.9 % IV SOLN
INTRAVENOUS | Status: DC | PRN
Start: 2021-05-12 — End: 2021-05-12

## 2021-05-12 SURGICAL SUPPLY — 36 items
ADAPTER IRRIG TUBE 2 SPIKE SOL (ADAPTER) ×4 IMPLANT
BAG URO DRAIN 4000ML (MISCELLANEOUS) ×2 IMPLANT
CATH FOLEY 3WAY 30CC 24FR (CATHETERS) ×1
CATH URETL OPEN END 4X70 (CATHETERS) ×1 IMPLANT
CATH URTH STD 24FR FL 3W 2 (CATHETERS) ×1 IMPLANT
CONTAINER COLLECT MORCELLATR (MISCELLANEOUS) ×1 IMPLANT
DRAPE UTILITY 15X26 TOWEL STRL (DRAPES) IMPLANT
ELECT BIVAP BIPO 22/24 DONUT (ELECTROSURGICAL)
ELECTRD BIVAP BIPO 22/24 DONUT (ELECTROSURGICAL) IMPLANT
FIBER LASER MOSES 550 DFL (Laser) ×1 IMPLANT
FILTER OVERFLOW MORCELLATOR (FILTER) ×1 IMPLANT
GAUZE 4X4 16PLY ~~LOC~~+RFID DBL (SPONGE) ×3 IMPLANT
GLOVE SURG UNDER POLY LF SZ7.5 (GLOVE) ×2 IMPLANT
GOWN STRL REUS W/ TWL LRG LVL3 (GOWN DISPOSABLE) ×1 IMPLANT
GOWN STRL REUS W/ TWL XL LVL3 (GOWN DISPOSABLE) ×1 IMPLANT
GOWN STRL REUS W/TWL LRG LVL3 (GOWN DISPOSABLE) ×1
GOWN STRL REUS W/TWL XL LVL3 (GOWN DISPOSABLE) ×1
HOLDER FOLEY CATH W/STRAP (MISCELLANEOUS) ×2 IMPLANT
IV NS IRRIG 3000ML ARTHROMATIC (IV SOLUTION) ×16 IMPLANT
KIT TURNOVER CYSTO (KITS) ×2 IMPLANT
MANIFOLD NEPTUNE II (INSTRUMENTS) IMPLANT
MBRN O SEALING YLW 17 FOR INST (MISCELLANEOUS) ×2
MEMBRANE SLNG YLW 17 FOR INST (MISCELLANEOUS) ×1 IMPLANT
MORCELLATOR COLLECT CONTAINER (MISCELLANEOUS) ×2
MORCELLATOR OVERFLOW FILTER (FILTER) ×2
MORCELLATOR ROTATION 4.75 335 (MISCELLANEOUS) ×2 IMPLANT
PACK CYSTO AR (MISCELLANEOUS) ×2 IMPLANT
SET CYSTO W/LG BORE CLAMP LF (SET/KITS/TRAYS/PACK) ×2 IMPLANT
SET IRRIG Y TYPE TUR BLADDER L (SET/KITS/TRAYS/PACK) ×2 IMPLANT
SLEEVE PROTECTION STRL DISP (MISCELLANEOUS) ×4 IMPLANT
SURGILUBE 2OZ TUBE FLIPTOP (MISCELLANEOUS) ×2 IMPLANT
SYR TOOMEY IRRIG 70ML (MISCELLANEOUS) ×2
SYRINGE TOOMEY IRRIG 70ML (MISCELLANEOUS) ×1 IMPLANT
TUBE PUMP MORCELLATOR PIRANHA (TUBING) ×2 IMPLANT
WATER STERILE IRR 1000ML POUR (IV SOLUTION) ×2 IMPLANT
WATER STERILE IRR 500ML POUR (IV SOLUTION) ×2 IMPLANT

## 2021-05-12 NOTE — Progress Notes (Signed)
30 minutes foley education done with patient and family.  All questions answered.  Informed pt/family about resources and to refer to paperwork for foley care if need reminder of how to care for.  Verbalized understanding of reasons to go to ED ?

## 2021-05-12 NOTE — Discharge Instructions (Addendum)

## 2021-05-12 NOTE — Anesthesia Preprocedure Evaluation (Signed)
Anesthesia Evaluation  ?Patient identified by MRN, date of birth, ID band ?Patient awake ? ? ? ?Reviewed: ?Allergy & Precautions, H&P , NPO status , Patient's Chart, lab work & pertinent test results ? ?History of Anesthesia Complications ?Negative for: history of anesthetic complications ? ?Airway ?Mallampati: III ? ?TM Distance: >3 FB ? ? ? ? Dental ? ?(+) Poor Dentition, Chipped, Missing, Loose, Dental Advisory Given,  ?Very poor dentition with multiple loose teeth:   ?Pulmonary ?asthma , neg sleep apnea, neg COPD, Patient abstained from smoking.Not current smoker, former smoker,  ?  ?breath sounds clear to auscultation ? ? ? ? ? ? Cardiovascular ?Exercise Tolerance: Good ?METShypertension, Pt. on medications ?(-) CAD and (-) Past MI (-) dysrhythmias  ?Rhythm:Regular Rate:Normal ? ? ?  ?Neuro/Psych ?negative neurological ROS ? negative psych ROS  ? GI/Hepatic ?negative GI ROS, Neg liver ROS, neg GERD  ,  ?Endo/Other  ?diabetes, Type 2, Insulin Dependent ? Renal/GU ?negative Renal ROS  ? ?  ?Musculoskeletal ? ? Abdominal ?  ?Peds ? Hematology ?negative hematology ROS ?(+)   ?Anesthesia Other Findings ?Past Medical History: ?No date: Abdominal hernia ?No date: Diabetes mellitus ([place]) ?    Comment:  Pt taking Insulin ?No date: Enlarged prostate ?No date: Hyperlipidemia ?No date: Hypertension ? ?Past Surgical History: ?No date: CIRCUMCISION ? ?BMI   ? Body Mass Index:  29.60 kg/m?  ?  ? ? Reproductive/Obstetrics ?negative OB ROS ? ?  ? ? ? ? ? ? ? ? ? ? ? ? ? ?  ?  ? ? ? ? ? ? ? ? ?Anesthesia Physical ? ?Anesthesia Plan ? ?ASA: 3 ? ?Anesthesia Plan: General  ? ?Post-op Pain Management: Ofirmev IV (intra-op)*  ? ?Induction: Intravenous ? ?PONV Risk Score and Plan: 3 and Ondansetron, Dexamethasone and Treatment may vary due to age or medical condition ? ?Airway Management Planned: Oral ETT and Video Laryngoscope Planned ? ?Additional Equipment: None ? ?Intra-op Plan:  ? ?Post-operative  Plan: Extubation in OR ? ?Informed Consent: I have reviewed the patients History and Physical, chart, labs and discussed the procedure including the risks, benefits and alternatives for the proposed anesthesia with the patient or authorized representative who has indicated his/her understanding and acceptance.  ? ? ? ?Dental Advisory Given ? ?Plan Discussed with: Anesthesiologist and CRNA ? ?Anesthesia Plan Comments: (Discussed risks of anesthesia with patient, including PONV, sore throat, lip/dental damage. Rare risks discussed as well, such as cardiorespiratory and neurological sequelae. Patient understands. ?Very poor dentition with some loose teeth. Extensively advised patient about dental risks.)  ? ? ? ? ? ? ?Anesthesia Quick Evaluation ? ?

## 2021-05-12 NOTE — Transfer of Care (Signed)
Immediate Anesthesia Transfer of Care Note ? ?Patient: Malik Bridges ? ?Procedure(s) Performed: HOLEP-LASER ENUCLEATION OF THE PROSTATE WITH MORCELLATION ? ?Patient Location: PACU ? ?Anesthesia Type:General ? ?Level of Consciousness: awake ? ?Airway & Oxygen Therapy: Patient Spontanous Breathing and Patient connected to face mask oxygen ? ?Post-op Assessment: Report given to RN and Post -op Vital signs reviewed and stable ? ?Post vital signs: Reviewed and stable ? ?Last Vitals:  ?Vitals Value Taken Time  ?BP 164/75 05/12/21 1234  ?Temp    ?Pulse 72 05/12/21 1234  ?Resp 12 05/12/21 1234  ?SpO2 99 % 05/12/21 1234  ?Vitals shown include unvalidated device data. ? ?Last Pain:  ?Vitals:  ? 05/12/21 1026  ?TempSrc: Oral  ?PainSc: 10-Worst pain ever  ?   ? ?  ? ?Complications: No notable events documented. ?

## 2021-05-12 NOTE — Anesthesia Postprocedure Evaluation (Signed)
Anesthesia Post Note ? ?Patient: Malik Bridges ? ?Procedure(s) Performed: HOLEP-LASER ENUCLEATION OF THE PROSTATE WITH MORCELLATION ? ?Patient location during evaluation: PACU ?Anesthesia Type: General ?Level of consciousness: awake and alert ?Pain management: pain level controlled ?Vital Signs Assessment: post-procedure vital signs reviewed and stable ?Respiratory status: spontaneous breathing, nonlabored ventilation, respiratory function stable and patient connected to nasal cannula oxygen ?Cardiovascular status: blood pressure returned to baseline and stable ?Postop Assessment: no apparent nausea or vomiting ?Anesthetic complications: no ? ? ?No notable events documented. ? ? ?Last Vitals:  ?Vitals:  ? 05/12/21 1321 05/12/21 1350  ?BP:  (!) 165/89  ?Pulse:  90  ?Resp:  16  ?Temp: (!) 36.4 ?C (!) 36.3 ?C  ?SpO2:  100%  ?  ?Last Pain:  ?Vitals:  ? 05/12/21 1350  ?TempSrc: Temporal  ?PainSc: 0-No pain  ? ? ?  ?  ?  ?  ?  ?  ? ?Arita Miss ? ? ? ? ?

## 2021-05-12 NOTE — H&P (Signed)
? ?  05/12/21 ?10:11 AM  ? ?Malik Bridges ?[birth year] ?[id number] ? ?CC: BPH ? ?HPI: ?71 year old male who has been followed by Dr. Junious Silk and Dr. Bernardo Heater for BPH, elevated PSA, and urinary symptoms.  PSA has been mildly elevated at 5-6.7, and he underwent a negative biopsy he recently underwent work-up for consideration of an outlet procedure with Dr. Bernardo Heater and Derma in 2021 showed a 93 g prostate, and cystoscopy recently showed lateral lobe hypertrophy with a high bladder neck, no suspicious bladder lesions with moderate trabeculation.  He was referred for discussion of HOLEP. ?  ?His urinary symptoms are primarily weak stream, feeling of incomplete emptying, urgency, and frequency. ?  ? ?PMH: ?Past Medical History:  ?Diagnosis Date  ? Abdominal hernia   ? Asthma   ? Diabetes mellitus ([place])   ? Pt taking Insulin  ? Elevated PSA 05/2019  ? Enlarged prostate   ? Hydrocele, bilateral 06/2019  ? Hyperlipidemia   ? Hypertension   ? ? ?Surgical History: ?Past Surgical History:  ?Procedure Laterality Date  ? CIRCUMCISION    ? COLONOSCOPY WITH PROPOFOL N/A 05/15/2018  ? Procedure: COLONOSCOPY WITH PROPOFOL;  Surgeon: Lin Landsman, MD;  Location: [facility] ENDOSCOPY;  Service: Gastroenterology;  Laterality: N/A;  ? EYE SURGERY Bilateral   ? cataract extractions  ? HYDROCELE EXCISION Right 07/21/2019  ? Procedure: HYDROCELECTOMY ADULT;  Surgeon: Abbie Sons, MD;  Location: [facility] ORS;  Service: Urology;  Laterality: Right;  ? ROBOT ASSISTED INGUINAL HERNIA REPAIR Bilateral 05/01/2018  ? Procedure: ROBOT ASSISTED INGUINAL HERNIA REPAIR;  Surgeon: Jules Husbands, MD;  Location: [facility] ORS;  Service: General;  Laterality: Bilateral;  ? UMBILICAL HERNIA REPAIR  05/01/2018  ? Procedure: HERNIA REPAIR UMBILICAL ADULT;  Surgeon: Jules Husbands, MD;  Location: [facility] ORS;  Service: General;;  ? ? ? ?Family History: ?History reviewed. No pertinent family history. ? ?Social History:  reports that he has quit smoking. His smoking use  included cigarettes. He has never been exposed to tobacco smoke. His smokeless tobacco use includes snuff. He reports that he does not currently use alcohol. He reports that he does not use drugs. ? ?Constitutional:  Alert and oriented, No acute distress. ?Cardiovascular: RRR ?Respiratory: CTA bilaterally ?GI: Abdomen is soft, nontender, nondistended, no abdominal masses ? ? ?Laboratory Data: ?Culture 04/26/21 mixed flora ? ?Assessment & Plan:   ?71 yo M with BPH ~95g prostate and obstructive symptoms. ? ?We discussed the risks and benefits of HoLEP at length.  The procedure requires general anesthesia and takes 1 to 2 hours, and a holmium laser is used to enucleate the prostate and push this tissue into the bladder.  A morcellator is then used to remove this tissue, which is sent for pathology.  The vast majority(>95%) of patients are able to discharge the same day with a catheter in place for 2 to 3 days, and will follow-up in clinic for a voiding trial.  We specifically discussed the risks of bleeding, infection, retrograde ejaculation, temporary urgency and urge incontinence, very low risk of long-term incontinence, urethral stricture/bladder neck contracture, pathologic evaluation of prostate tissue and possible detection of prostate cancer or other malignancy, and possible need for additional procedures ? ?HoLEP ?May need OAB meds in future ? ?Nickolas Madrid, MD ?05/12/2021 ? ?[facility] ?[address] ?[place], [place] [address] ?[phone] ? ?

## 2021-05-12 NOTE — Anesthesia Procedure Notes (Signed)
Procedure Name: Intubation ?Date/Time: 05/12/2021 10:57 AM ?Performed by: Loletha Grayer, CRNA ?Pre-anesthesia Checklist: Patient identified, Patient being monitored, Timeout performed, Emergency Drugs available and Suction available ?Patient Re-evaluated:Patient Re-evaluated prior to induction ?Oxygen Delivery Method: Circle system utilized ?Preoxygenation: Pre-oxygenation with 100% oxygen ?Induction Type: IV induction ?Ventilation: Mask ventilation without difficulty ?Laryngoscope Size: 3 and McGraph ?Grade View: Grade I ?Tube type: Oral ?Tube size: 7.0 mm ?Number of attempts: 1 ?Airway Equipment and Method: Stylet ?Placement Confirmation: ETT inserted through vocal cords under direct vision, positive ETCO2 and breath sounds checked- equal and bilateral ?Secured at: 21 cm ?Tube secured with: Tape ?Dental Injury: Teeth and Oropharynx as per pre-operative assessment  ? ? ? ? ?

## 2021-05-12 NOTE — Op Note (Signed)
Date of procedure: 05/12/21 ? ?Preoperative diagnosis:  ?BPH with obstruction ? ?Postoperative diagnosis:  ?Same ? ?Procedure: ?HoLEP (Holmium Laser Enucleation of the Prostate) ? ?Surgeon: Nickolas Madrid, MD ? ?Anesthesia: General ? ?Complications: None ? ?Intraoperative findings:  ?Large prostate with high bladder neck and large median lobe, ureteral orifices very close to bladder neck, moderate bladder trabeculations, no suspicious lesions ?Ureteral orifices and verumontanum intact at conclusion of case ?Excellent hemostasis ? ?EBL: Minimal ? ?Specimens: Prostate chips ? ?Enucleation time: 39 minutes ? ?Morcellation time: 13 minutes ? ?Intra-op weight: 68 g ? ?Drains: 24 French three-way, 60 cc in balloon ? ?Indication: Malik Bridges is a 71 y.o. patient with BPH and obstructive symptoms and prostate measuring 90 g on ultrasound, prostate biopsy was negative, he opted for HOLEP.  After reviewing the management options for treatment, they elected to proceed with the above surgical procedure(s). We have discussed the potential benefits and risks of the procedure, side effects of the proposed treatment, the likelihood of the patient achieving the goals of the procedure, and any potential problems that might occur during the procedure or recuperation.  We specifically discussed the risks of bleeding, infection, hematuria and clot retention, need for additional procedures, possible overnight hospital stay, temporary urgency and incontinence, rare long-term incontinence, and retrograde ejaculation.  Informed consent has been obtained.  ? ?Description of procedure: ? ?The patient was taken to the operating room and general anesthesia was induced.  The patient was placed in the dorsal lithotomy position, prepped and draped in the usual sterile fashion, and preoperative antibiotics(Ancef) were administered.  SCDs were placed for DVT prophylaxis.  A preoperative time-out was performed.  ? ?Malik Bridges sounds were used to  gently dilated the urethra up to 63F. The 73 French continuous flow resectoscope was inserted into the urethra using the visual obturator  The prostate was large with a high bladder neck and massive median lobe. The bladder was thoroughly inspected and notable for moderate trabeculations.  The ureteral orifices were located in orthotopic position, and just behind the median lobe.  The laser was set to 2 J and 60 Hz and was used to make a lambda incision just proximal to the verumontanum down to the level of the capsule.  A 5 and 7 o'clock incisions were then made down to the level of the capsule from the bladder neck to the verumontanum, and the median lobe enucleated into the bladder.  The lateral lobes were then incised circumferentially until they were disconnected from the surrounding tissue.  The capsule was examined and laser was used for meticulous hemostasis.   ? ?The 71 French resectoscope was then switched out for the 37 French nephroscope and the lobes were morcellated and the tissue sent to pathology.  A 24 French three-way catheter was inserted easily with the aid of a catheter guide, and 60 cc were placed in the balloon.  Urine was clear.  The catheter irrigated easily with a Toomey syringe.  CBI was initiated. A belladonna suppository was placed. ? ?The patient tolerated the procedure well without any immediate complications and was extubated and transferred to the recovery room in stable condition.  Urine was clear on fast CBI. ? ?Disposition: Stable to PACU ? ?Plan: ?Wean CBI in PACU, anticipate discharge home today with Foley removal in clinic in 2-3 days ? ?Nickolas Madrid, MD ?05/12/2021 ? ?

## 2021-05-13 ENCOUNTER — Encounter: Payer: Self-pay | Admitting: Urology

## 2021-05-15 ENCOUNTER — Other Ambulatory Visit: Payer: Self-pay

## 2021-05-15 ENCOUNTER — Ambulatory Visit: Payer: Medicare Other

## 2021-05-15 DIAGNOSIS — Z978 Presence of other specified devices: Secondary | ICD-10-CM

## 2021-05-15 DIAGNOSIS — R399 Unspecified symptoms and signs involving the genitourinary system: Secondary | ICD-10-CM

## 2021-05-15 NOTE — Progress Notes (Signed)
Patient presents in office with complaints of catheter not draining into bag. Patient placed on the exam table, upon exam patient's catheter was twisted and in knot. Catheter was untwisted and began to flow freely into the bag. Urine was red and clear. Patient also notes bladder spasms, and spasm was observed while pt was in office. Pt advised to keep follow up for tomorrow. Stat lock and leg strap placed to keep catheter in place. Patient advised to push fluids.  ?

## 2021-05-16 ENCOUNTER — Ambulatory Visit (INDEPENDENT_AMBULATORY_CARE_PROVIDER_SITE_OTHER): Payer: Medicare Other | Admitting: Urology

## 2021-05-16 ENCOUNTER — Ambulatory Visit: Payer: Medicare Other | Admitting: Urology

## 2021-05-16 DIAGNOSIS — R399 Unspecified symptoms and signs involving the genitourinary system: Secondary | ICD-10-CM | POA: Diagnosis not present

## 2021-05-16 DIAGNOSIS — Z978 Presence of other specified devices: Secondary | ICD-10-CM | POA: Diagnosis not present

## 2021-05-16 LAB — BLADDER SCAN AMB NON-IMAGING: Scan Result: 24

## 2021-05-16 LAB — SURGICAL PATHOLOGY

## 2021-05-16 NOTE — Progress Notes (Signed)
Catheter Removal ? ?Patient is present today for a catheter removal.  72m of water was drained from the balloon. A 24FR foley cath was removed from the bladder no complications were noted . Patient tolerated well. ? ?Performed by: JGaspar ColaCMA  ? ?Follow up/ Additional notes: This afternoon's PVR was 24 mL. Keep follow up on 08/09/2021 with Dr. SDiamantina Providence  ?

## 2021-05-18 ENCOUNTER — Telehealth: Payer: Self-pay

## 2021-05-18 NOTE — Telephone Encounter (Signed)
-----   Message from Billey Co, MD sent at 05/16/2021  4:28 PM EDT ----- ?Good news, no prostate cancer on HOLEP tissue, keep follow-up as scheduled ? ?Nickolas Madrid, MD ?05/16/2021 ? ? ?

## 2021-05-18 NOTE — Telephone Encounter (Signed)
Called pt informed him of the information below. Pt voiced understanding.  

## 2021-08-09 ENCOUNTER — Encounter: Payer: Self-pay | Admitting: Urology

## 2021-08-09 ENCOUNTER — Ambulatory Visit (INDEPENDENT_AMBULATORY_CARE_PROVIDER_SITE_OTHER): Payer: Medicare Other | Admitting: Urology

## 2021-08-09 VITALS — BP 188/78 | HR 69 | Ht 62.0 in | Wt 165.0 lb

## 2021-08-09 DIAGNOSIS — N3281 Overactive bladder: Secondary | ICD-10-CM

## 2021-08-09 DIAGNOSIS — N529 Male erectile dysfunction, unspecified: Secondary | ICD-10-CM | POA: Diagnosis not present

## 2021-08-09 DIAGNOSIS — N401 Enlarged prostate with lower urinary tract symptoms: Secondary | ICD-10-CM

## 2021-08-09 LAB — BLADDER SCAN AMB NON-IMAGING

## 2021-08-09 MED ORDER — TADALAFIL 10 MG PO TABS: 10.0000 mg | ORAL_TABLET | Freq: Every day | ORAL | 6 refills | Status: DC | PRN

## 2021-08-09 MED ORDER — OXYBUTYNIN CHLORIDE ER 10 MG PO TB24: 10.0000 mg | ORAL_TABLET | Freq: Every day | ORAL | 11 refills | Status: DC

## 2021-08-09 NOTE — Progress Notes (Signed)
   08/09/2021 9:53 AM   Malik Bridges 01-Jun-1950 [id number]  Reason for visit: Follow up BPH status post HOLEP, OAB,  ED  HPI: 71 year old male who had been followed by Dr. Bernardo Heater for BPH, elevated PSA, and urinary symptoms, underwent a negative biopsy, and further evaluation showed a 93 g prostate with cystoscopy showing lateral lobe hypertrophy and a high bladder neck with moderate trabeculation.  He had mixed symptoms of weak stream, sensation of incomplete emptying, urgency, and frequency.  We had discussed previously with his mixed voiding and storage symptoms that he may need to be on OAB medications long-term, as he was on Myrbetriq pre-operatively.  He is urinating with a very strong stream, but sounds like he is having a fair amount of urgency and frequency during the day and night, as well as still wearing a depends.  PVR today is normal at 0 mL.  He is drinking mostly water during the day and some Kool-Aid.  We again reviewed the differences between voiding and storage symptoms, and the need for trial of an OAB medication.  He also reports some problems with maintaining erections since surgery, though it sounds like he also had some ED prior to surgery.  I recommended a trial of Cialis 5 mg daily, and risks and benefits were discussed.  Trial of oxybutynin 10 mg XL for OAB symptoms, can transition to Myrbetriq 50 mg if no significant improvement Trial of Cialis 5 mg, discussed can titrate up as needed RTC 6 weeks symptom check   Billey Co, MD  [facility] [address] [place], [place] [address] [phone]

## 2021-09-19 NOTE — Progress Notes (Signed)
09/20/2021 10:17 AM   Malik Bridges 07/15/1950 [id number]  Referring provider: Marya Fossa, PA-C [address] [place],  [place] [address]  Urological history: 1.  Elevated PSA -PSA trend  Prostate Specific Ag, Serum  Latest Ref Rng 0.0 - 4.0 ng/mL  10/01/2018 5.2 (H)   05/13/2019 6.7 (H)   01/15/2020 5.8 (H)   07/15/2020 6.0 (H)   03/10/2021 6.7 (H)     Legend: (H) High  -Biopsy Dr. Mena Goes April 2021 PSA 6.7; prostate volume 93 g -Benign pathology  2. BPH with LU TS -cysto, 04/2021 - Coapting lateral lobes  prostate with hypervascularity/friability- Elevated bladder neck -prostate volume 100 grams -HoLEP, 05/2021 -pathology negative -I PSS 9/2  3. ED -contributing factors of age, BPH, HTN, DM, sleep apnea, HLD, depression and history of smoking -SHIM 12 -tadalafil 5 mg, on-demand-dosing  4. OAB -Contributing factors of age, hypertension, diabetes, history of smoking, sleep apnea and diuretics -PVR 0 mL -oxybutynin XL 10 mg daily   Chief Complaint  Patient presents with   Other    HPI: Malik Bridges is a 71 y.o. male who presents today after a 6-week trial of oxybutynin and tadalafil.  He has noticed a reduction in urgency and frequency with the oxybutynin.  Patient denies any modifying or aggravating factors.  Patient denies any gross hematuria, dysuria or suprapubic/flank pain.  Patient denies any fevers, chills, nausea or vomiting.     IPSS     Row Name 09/20/21 0900         International Prostate Symptom Score   How often have you had the sensation of not emptying your bladder? More than half the time     How often have you had to urinate less than every two hours? About half the time     How often have you found you stopped and started again several times when you urinated? Not at All     How often have you found it difficult to postpone urination? Not at All     How often have you had a weak urinary stream? Not at All     How often have  you had to strain to start urination? Not at All     How many times did you typically get up at night to urinate? 2 Times     Total IPSS Score 9       Quality of Life due to urinary symptoms   If you were to spend the rest of your life with your urinary condition just the way it is now how would you feel about that? Mostly Satisfied              Score:  1-7 Mild 8-19 Moderate 20-35 Severe   Patient still having spontaneous erections.  He denies any pain or curvature with erections.  He is not having success with the tadalafil 10 mg, on-demand-dosing.   SHIM     Row Name 09/20/21 0944         SHIM: Over the last 6 months:   How do you rate your confidence that you could get and keep an erection? Very Low     When you had erections with sexual stimulation, how often were your erections hard enough for penetration (entering your partner)? A Few Times (much less than half the time)     During sexual intercourse, how often were you able to maintain your erection after you had penetrated (entered) your partner? A  Few Times (much less than half the time)     During sexual intercourse, how difficult was it to maintain your erection to completion of intercourse? Slightly Difficult     When you attempted sexual intercourse, how often was it satisfactory for you? Sometimes (about half the time)       SHIM Total Score   SHIM 12              Score: 1-7 Severe ED 8-11 Moderate ED 12-16 Mild-Moderate ED 17-21 Mild ED 22-25 No ED     PMH: Past Medical History:  Diagnosis Date   Abdominal hernia    Asthma    Diabetes mellitus ([facility])    Pt taking Insulin   Elevated PSA 05/2019   Enlarged prostate    Hydrocele, bilateral 06/2019   Hyperlipidemia    Hypertension     Surgical History: Past Surgical History:  Procedure Laterality Date   CIRCUMCISION     COLONOSCOPY WITH PROPOFOL N/A 05/15/2018   Procedure: COLONOSCOPY WITH PROPOFOL;  Surgeon: Toney Reil, MD;   Location: [facility] ENDOSCOPY;  Service: Gastroenterology;  Laterality: N/A;   EYE SURGERY Bilateral    cataract extractions   HOLEP-LASER ENUCLEATION OF THE PROSTATE WITH MORCELLATION N/A 05/12/2021   Procedure: HOLEP-LASER ENUCLEATION OF THE PROSTATE WITH MORCELLATION;  Surgeon: Sondra Come, MD;  Location: [facility] ORS;  Service: Urology;  Laterality: N/A;   HYDROCELE EXCISION Right 07/21/2019   Procedure: HYDROCELECTOMY ADULT;  Surgeon: Riki Altes, MD;  Location: [facility] ORS;  Service: Urology;  Laterality: Right;   ROBOT ASSISTED INGUINAL HERNIA REPAIR Bilateral 05/01/2018   Procedure: ROBOT ASSISTED INGUINAL HERNIA REPAIR;  Surgeon: Leafy Ro, MD;  Location: [facility] ORS;  Service: General;  Laterality: Bilateral;   UMBILICAL HERNIA REPAIR  05/01/2018   Procedure: HERNIA REPAIR UMBILICAL ADULT;  Surgeon: Leafy Ro, MD;  Location: [facility] ORS;  Service: General;;    Home Medications:  Allergies as of 09/20/2021       Reactions   Hydrochlorothiazide Other (See Comments)   K to 3.0 on Zestoretic 20-25        Medication List        Accurate as of September 20, 2021 10:17 AM. If you have any questions, ask your nurse or doctor.          Accu-Chek Guide Me w/Device Kit   Accu-Chek Softclix Lancets lancets SMARTSIG:Topical   albuterol 108 (90 Base) MCG/ACT inhaler Commonly known as: VENTOLIN HFA Inhale 2 puffs into the lungs every 4 (four) hours as needed for wheezing or shortness of breath.   atorvastatin 20 MG tablet Commonly known as: LIPITOR Take 20 mg by mouth daily.   empagliflozin 25 MG Tabs tablet Commonly known as: JARDIANCE Take 25 mg by mouth daily.   Flovent HFA 110 MCG/ACT inhaler Generic drug: fluticasone Inhale 2 puffs into the lungs 2 (two) times daily.   glucose blood test strip Check twice a day, E11.42   ibuprofen 200 MG tablet Commonly known as: ADVIL Take 200 mg by mouth every 6 (six) hours as needed for mild pain or moderate pain.   insulin  glargine 100 UNIT/ML injection Commonly known as: LANTUS Inject 30-60 Units into the skin See admin instructions. Inject 40 units in the morning and 60 units at night   lisinopril 20 MG tablet Commonly known as: ZESTRIL Take 20 mg by mouth daily.   metFORMIN 1000 MG tablet Commonly known as: GLUCOPHAGE Take 1,000 mg by mouth 2 (two)  times daily with a meal.   oxybutynin 10 MG 24 hr tablet Commonly known as: DITROPAN-XL Take 1 tablet (10 mg total) by mouth daily.   spironolactone 25 MG tablet Commonly known as: ALDACTONE Take 12.5 mg by mouth daily.   tadalafil 10 MG tablet Commonly known as: Cialis Take 1 tablet (10 mg total) by mouth daily as needed for erectile dysfunction.   traZODone 50 MG tablet Commonly known as: DESYREL Take 50 mg by mouth at bedtime.   TRUEplus Pen Needles 31G X 6 MM Misc Generic drug: Insulin Pen Needle        Allergies:  Allergies  Allergen Reactions   Hydrochlorothiazide Other (See Comments)    K to 3.0 on Zestoretic 20-25    Family History: History reviewed. No pertinent family history.  Social History:  reports that he has quit smoking. His smoking use included cigarettes. He has been exposed to tobacco smoke. His smokeless tobacco use includes snuff. He reports that he does not currently use alcohol. He reports that he does not use drugs.  ROS: Pertinent ROS in HPI  Physical Exam: BP 137/75   Pulse 77   Ht 5\' 7"  (1.702 m)   Wt 157 lb (71.2 kg)   BMI 24.59 kg/m   Constitutional:  Well nourished. Alert and oriented, No acute distress. HEENT: [place] AT, moist mucus membranes.  Trachea midline Cardiovascular: No clubbing, cyanosis, or edema. Respiratory: Normal respiratory effort, no increased work of breathing. Neurologic: Grossly intact, no focal deficits, moving all 4 extremities. Psychiatric: Normal mood and affect.  Laboratory Data: Lab Results  Component Value Date   WBC 3.6 (L) 05/08/2021   HGB 15.6 05/08/2021   HCT  47.8 05/08/2021   MCV 85.1 05/08/2021   PLT 220 05/08/2021    Lab Results  Component Value Date   CREATININE 0.74 05/08/2021   Urinalysis    Component Value Date/Time   APPEARANCEUR Clear 04/26/2021 1543   GLUCOSEU Trace (A) 04/26/2021 1543   BILIRUBINUR Negative 04/26/2021 1543   PROTEINUR 1+ (A) 04/26/2021 1543   NITRITE Negative 04/26/2021 1543   LEUKOCYTESUR Negative 04/26/2021 1543  I have reviewed the labs.   Pertinent Imaging:  09/20/21 09:37  Scan Result 0    Assessment & Plan:    1. OAB -At goal with oxybutynin XL 10 mg daily -Continue the oxybutynin  2. ED -He has not titrated up with the tadalafil, so have made arrangements with his pharmacy not but the tadalafil in the bubble pack and put it in a separate bottle so that he can take 2 tablets of the 10 mg tadalafil  Return in about 1 month (around 10/21/2021) for SHIM.  These notes generated with voice recognition software. I apologize for typographical errors.  Michiel Cowboy, PA-C  [facility] [address]  [address] [place], [place] [address] [phone]

## 2021-09-20 ENCOUNTER — Ambulatory Visit (INDEPENDENT_AMBULATORY_CARE_PROVIDER_SITE_OTHER): Payer: Medicare Other | Admitting: Urology

## 2021-09-20 ENCOUNTER — Encounter: Payer: Self-pay | Admitting: Urology

## 2021-09-20 VITALS — BP 137/75 | HR 77 | Ht 67.0 in | Wt 157.0 lb

## 2021-09-20 DIAGNOSIS — N529 Male erectile dysfunction, unspecified: Secondary | ICD-10-CM

## 2021-09-20 DIAGNOSIS — N3281 Overactive bladder: Secondary | ICD-10-CM

## 2021-09-20 LAB — BLADDER SCAN AMB NON-IMAGING: Scan Result: 0

## 2021-09-21 ENCOUNTER — Telehealth: Payer: Self-pay

## 2021-09-21 LAB — PSA: Prostate Specific Ag, Serum: 0.6 ng/mL (ref 0.0–4.0)

## 2021-09-21 NOTE — Telephone Encounter (Signed)
Notified pt as advised, pt expressed understanding.  ?

## 2021-09-21 NOTE — Telephone Encounter (Signed)
-----   Message from Nori Riis, PA-C sent at 09/21/2021  8:02 AM EDT ----- Please let Mr. Nill know that his PSA is at 0.6.  This is great!

## 2021-10-22 NOTE — Progress Notes (Deleted)
10/24/2021 8:35 PM   Malik Bridges Sep 23, 1950 [id number]  Referring provider: Marya Fossa, PA-C [address] [place],  [place] [address]  Urological history: 1.  Elevated PSA -PSA trend  Prostate Specific Ag, Serum  Latest Ref Rng 0.0 - 4.0 ng/mL  10/01/2018 5.2 (H)   05/13/2019 6.7 (H)   01/15/2020 5.8 (H)   07/15/2020 6.0 (H)   03/10/2021 6.7 (H)     Legend: (H) High  09/20/2021   0.6 -Biopsy Dr. Mena Goes April 2021 PSA 6.7; prostate volume 93 g -Benign pathology - HoLEP, 05/2021 - negative   2. BPH with LU TS -cysto, 04/2021 - Coapting lateral lobes  prostate with hypervascularity/friability- Elevated bladder neck -prostate volume 100 grams -HoLEP, 05/2021 -pathology negative -I PSS 9/2  3. ED -contributing factors of age, BPH, HTN, DM, sleep apnea, HLD, depression and history of smoking -SHIM *** -tadalafil 5 mg, on-demand-dosing  4. OAB -Contributing factors of age, hypertension, diabetes, history of smoking, sleep apnea and diuretics -PVR 0 mL -oxybutynin XL 10 mg daily   No chief complaint on file.   HPI: Malik Bridges is a 71 y.o. male who presents today after a 25-month trial of tadalafil 20 mg, on-demand-dosing.  At his visit on 09/20/2021, he was at goal w/ oxybutynin XL 10 mg daily for his OAB.  He was not at goal w/ tadalafil 10 mg, on-demand-dosing.  He was then increased to tadalafil 20 mg, on-demand-dosing.    Score: 1-7 Severe ED 8-11 Moderate ED 12-16 Mild-Moderate ED 17-21 Mild ED 22-25 No ED     PMH: Past Medical History:  Diagnosis Date   Abdominal hernia    Asthma    Diabetes mellitus (HCC)    Pt taking Insulin   Elevated PSA 05/2019   Enlarged prostate    Hydrocele, bilateral 06/2019   Hyperlipidemia    Hypertension     Surgical History: Past Surgical History:  Procedure Laterality Date   CIRCUMCISION     COLONOSCOPY WITH PROPOFOL N/A 05/15/2018   Procedure: COLONOSCOPY WITH PROPOFOL;  Surgeon: Toney Reil, MD;  Location: [facility];  Service: Gastroenterology;  Laterality: N/A;   EYE SURGERY Bilateral    cataract extractions   HOLEP-LASER ENUCLEATION OF THE PROSTATE WITH MORCELLATION N/A 05/12/2021   Procedure: HOLEP-LASER ENUCLEATION OF THE PROSTATE WITH MORCELLATION;  Surgeon: Sondra Come, MD;  Location: [facility];  Service: Urology;  Laterality: N/A;   HYDROCELE EXCISION Right 07/21/2019   Procedure: HYDROCELECTOMY ADULT;  Surgeon: Riki Altes, MD;  Location: [facility];  Service: Urology;  Laterality: Right;   ROBOT ASSISTED INGUINAL HERNIA REPAIR Bilateral 05/01/2018   Procedure: ROBOT ASSISTED INGUINAL HERNIA REPAIR;  Surgeon: Leafy Ro, MD;  Location: [facility];  Service: General;  Laterality: Bilateral;   UMBILICAL HERNIA REPAIR  05/01/2018   Procedure: HERNIA REPAIR UMBILICAL ADULT;  Surgeon: Leafy Ro, MD;  Location: [facility];  Service: General;;    Home Medications:  Allergies as of 10/24/2021       Reactions   Hydrochlorothiazide Other (See Comments)   K to 3.0 on Zestoretic 20-25        Medication List        Accurate as of October 22, 2021  8:35 PM. If you have any questions, ask your nurse or doctor.          Accu-Chek Guide Me w/Device Kit   Accu-Chek Softclix Lancets lancets SMARTSIG:Topical   albuterol 108 (90 Base) MCG/ACT  inhaler Commonly known as: VENTOLIN HFA Inhale 2 puffs into the lungs every 4 (four) hours as needed for wheezing or shortness of breath.   atorvastatin 20 MG tablet Commonly known as: LIPITOR Take 20 mg by mouth daily.   empagliflozin 25 MG Tabs tablet Commonly known as: JARDIANCE Take 25 mg by mouth daily.   Flovent HFA 110 MCG/ACT inhaler Generic drug: fluticasone Inhale 2 puffs into the lungs 2 (two) times daily.   glucose blood test strip Check twice a day, E11.42   ibuprofen 200 MG tablet Commonly known as: ADVIL Take 200 mg by mouth every 6 (six) hours as needed for mild pain or moderate pain.    insulin glargine 100 UNIT/ML injection Commonly known as: LANTUS Inject 30-60 Units into the skin See admin instructions. Inject 40 units in the morning and 60 units at night   lisinopril 20 MG tablet Commonly known as: ZESTRIL Take 20 mg by mouth daily.   metFORMIN 1000 MG tablet Commonly known as: GLUCOPHAGE Take 1,000 mg by mouth 2 (two) times daily with a meal.   oxybutynin 10 MG 24 hr tablet Commonly known as: DITROPAN-XL Take 1 tablet (10 mg total) by mouth daily.   spironolactone 25 MG tablet Commonly known as: ALDACTONE Take 12.5 mg by mouth daily.   tadalafil 10 MG tablet Commonly known as: Cialis Take 1 tablet (10 mg total) by mouth daily as needed for erectile dysfunction.   traZODone 50 MG tablet Commonly known as: DESYREL Take 50 mg by mouth at bedtime.   TRUEplus Pen Needles 31G X 6 MM Misc Generic drug: Insulin Pen Needle        Allergies:  Allergies  Allergen Reactions   Hydrochlorothiazide Other (See Comments)    K to 3.0 on Zestoretic 20-25    Family History: No family history on file.  Social History:  reports that he has quit smoking. His smoking use included cigarettes. He has been exposed to tobacco smoke. His smokeless tobacco use includes snuff. He reports that he does not currently use alcohol. He reports that he does not use drugs.  ROS: Pertinent ROS in HPI  Physical Exam: There were no vitals taken for this visit.  Constitutional:  Well nourished. Alert and oriented, No acute distress. HEENT: [place] AT, moist mucus membranes.  Trachea midline Cardiovascular: No clubbing, cyanosis, or edema. Respiratory: Normal respiratory effort, no increased work of breathing. GU: No CVA tenderness.  No bladder fullness or masses.  Patient with circumcised/uncircumcised phallus. ***Foreskin easily retracted***  Urethral meatus is patent.  No penile discharge. No penile lesions or rashes. Scrotum without lesions, cysts, rashes and/or edema.  Testicles  are located scrotally bilaterally. No masses are appreciated in the testicles. Left and right epididymis are normal. Rectal: Patient with  normal sphincter tone. Anus and perineum without scarring or rashes. No rectal masses are appreciated. Prostate is approximately *** grams, *** nodules are appreciated. Seminal vesicles are normal. Neurologic: Grossly intact, no focal deficits, moving all 4 extremities. Psychiatric: Normal mood and affect.   Laboratory Data:  Prostate Specific Ag, Serum  Latest Ref Rng 0.0 - 4.0 ng/mL  10/01/2018 5.2 (H)   05/13/2019 6.7 (H)   01/15/2020 5.8 (H)   07/15/2020 6.0 (H)   03/10/2021 6.7 (H)   09/20/2021 0.6     Legend: (H) High I have reviewed the labs.    Pertinent Imaging: N/A   Assessment & Plan:    1. OAB -At goal with oxybutynin XL 10 mg daily -  Continue the oxybutynin  2. ED -He has not titrated up with the tadalafil, so have made arrangements with his pharmacy not but the tadalafil in the bubble pack and put it in a separate bottle so that he can take 2 tablets of the 10 mg tadalafil  No follow-ups on file.  These notes generated with voice recognition software. I apologize for typographical errors.  Zara Council, PA-C  [facility] [address]  [place], [place] [address] [phone]

## 2021-10-24 ENCOUNTER — Encounter: Payer: Self-pay | Admitting: Urology

## 2021-10-24 ENCOUNTER — Ambulatory Visit: Payer: Medicare Other | Admitting: Urology

## 2021-10-24 DIAGNOSIS — N529 Male erectile dysfunction, unspecified: Secondary | ICD-10-CM

## 2021-11-03 ENCOUNTER — Ambulatory Visit: Payer: Medicare Other | Admitting: Urology

## 2021-11-07 NOTE — Progress Notes (Unsigned)
11/08/2021 9:37 PM   Malik Bridges Oct 22, 1950 [id number]  Referring provider: Kerri Perches, PA-C [place],  [place] [address]  Urological history: 1.  Elevated PSA -PSA trend  Prostate Specific Ag, Serum  Latest Ref Rng 0.0 - 4.0 ng/mL  10/01/2018 5.2 (H)   05/13/2019 6.7 (H)   01/15/2020 5.8 (H)   07/15/2020 6.0 (H)   03/10/2021 6.7 (H)     Legend: (H) High  09/20/2021   0.6 -Biopsy Dr. Junious Silk April 2021 PSA 6.7; prostate volume 93 g -Benign pathology - HoLEP, 05/2021 - negative   2. BPH with LU TS -cysto, 04/2021 - Coapting lateral lobes  prostate with hypervascularity/friability- Elevated bladder neck -prostate volume 100 grams -HoLEP, 05/2021 -pathology negative -I PSS 9/2  3. ED -contributing factors of age, BPH, HTN, DM, sleep apnea, HLD, depression and history of smoking -SHIM *** -tadalafil 5 mg, on-demand-dosing  4. OAB -Contributing factors of age, hypertension, diabetes, history of smoking, sleep apnea and diuretics -PVR 0 mL -oxybutynin XL 10 mg daily   No chief complaint on file.   HPI: Malik Bridges is a 71 y.o. male who presents today after a 67-monthtrial of tadalafil 20 mg, on-demand-dosing.  At his visit on 09/20/2021, he was at goal w/ oxybutynin XL 10 mg daily for his OAB.  He was not at goal w/ tadalafil 10 mg, on-demand-dosing.  He was then increased to tadalafil 20 mg, on-demand-dosing.    Score: 1-7 Severe ED 8-11 Moderate ED 12-16 Mild-Moderate ED 17-21 Mild ED 22-25 No ED     PMH: Past Medical History:  Diagnosis Date   Abdominal hernia    Asthma    Diabetes mellitus ([place]    Pt taking Insulin   Elevated PSA 05/2019   Enlarged prostate    Hydrocele, bilateral 06/2019   Hyperlipidemia    Hypertension     Surgical History: Past Surgical History:  Procedure Laterality Date   CIRCUMCISION     COLONOSCOPY WITH PROPOFOL N/A 05/15/2018   Procedure: COLONOSCOPY WITH PROPOFOL;  Surgeon: VLin Landsman MD;  Location: [facility];  Service: Gastroenterology;  Laterality: N/A;   EYE SURGERY Bilateral    cataract extractions   HOLEP-LASER ENUCLEATION OF THE PROSTATE WITH MORCELLATION N/A 05/12/2021   Procedure: HOLEP-LASER ENUCLEATION OF THE PROSTATE WITH MORCELLATION;  Surgeon: SBilley Co MD;  Location: [facility];  Service: Urology;  Laterality: N/A;   HYDROCELE EXCISION Right 07/21/2019   Procedure: HYDROCELECTOMY ADULT;  Surgeon: SAbbie Sons MD;  Location: [facility];  Service: Urology;  Laterality: Right;   ROBOT ASSISTED INGUINAL HERNIA REPAIR Bilateral 05/01/2018   Procedure: ROBOT ASSISTED INGUINAL HERNIA REPAIR;  Surgeon: PJules Husbands MD;  Location: [facility];  Service: General;  Laterality: Bilateral;   UMBILICAL HERNIA REPAIR  05/01/2018   Procedure: HERNIA REPAIR UMBILICAL ADULT;  Surgeon: PJules Husbands MD;  Location: [facility];  Service: General;;    Home Medications:  Allergies as of 11/08/2021       Reactions   Hydrochlorothiazide Other (See Comments)   K to 3.0 on Zestoretic 20-25        Medication List        Accurate as of November 07, 2021  9:37 PM. If you have any questions, ask your nurse or doctor.          Accu-Chek Guide Me w/Device Kit   Accu-Chek Softclix Lancets lancets SMARTSIG:Topical   albuterol 108 (90 Base) MCG/ACT  inhaler Commonly known as: VENTOLIN HFA Inhale 2 puffs into the lungs every 4 (four) hours as needed for wheezing or shortness of breath.   atorvastatin 20 MG tablet Commonly known as: LIPITOR Take 20 mg by mouth daily.   empagliflozin 25 MG Tabs tablet Commonly known as: JARDIANCE Take 25 mg by mouth daily.   Flovent HFA 110 MCG/ACT inhaler Generic drug: fluticasone Inhale 2 puffs into the lungs 2 (two) times daily.   glucose blood test strip Check twice a day, E11.42   ibuprofen 200 MG tablet Commonly known as: ADVIL Take 200 mg by mouth every 6 (six) hours as needed for mild pain or moderate  pain.   insulin glargine 100 UNIT/ML injection Commonly known as: LANTUS Inject 30-60 Units into the skin See admin instructions. Inject 40 units in the morning and 60 units at night   lisinopril 20 MG tablet Commonly known as: ZESTRIL Take 20 mg by mouth daily.   metFORMIN 1000 MG tablet Commonly known as: GLUCOPHAGE Take 1,000 mg by mouth 2 (two) times daily with a meal.   oxybutynin 10 MG 24 hr tablet Commonly known as: DITROPAN-XL Take 1 tablet (10 mg total) by mouth daily.   spironolactone 25 MG tablet Commonly known as: ALDACTONE Take 12.5 mg by mouth daily.   tadalafil 10 MG tablet Commonly known as: Cialis Take 1 tablet (10 mg total) by mouth daily as needed for erectile dysfunction.   traZODone 50 MG tablet Commonly known as: DESYREL Take 50 mg by mouth at bedtime.   TRUEplus Pen Needles 31G X 6 MM Misc Generic drug: Insulin Pen Needle        Allergies:  Allergies  Allergen Reactions   Hydrochlorothiazide Other (See Comments)    K to 3.0 on Zestoretic 20-25    Family History: No family history on file.  Social History:  reports that he has quit smoking. His smoking use included cigarettes. He has been exposed to tobacco smoke. His smokeless tobacco use includes snuff. He reports that he does not currently use alcohol. He reports that he does not use drugs.  ROS: Pertinent ROS in HPI  Physical Exam: There were no vitals taken for this visit.  Constitutional:  Well nourished. Alert and oriented, No acute distress. HEENT: [place] AT, moist mucus membranes.  Trachea midline Cardiovascular: No clubbing, cyanosis, or edema. Respiratory: Normal respiratory effort, no increased work of breathing. GU: No CVA tenderness.  No bladder fullness or masses.  Patient with circumcised/uncircumcised phallus. ***Foreskin easily retracted***  Urethral meatus is patent.  No penile discharge. No penile lesions or rashes. Scrotum without lesions, cysts, rashes and/or edema.   Testicles are located scrotally bilaterally. No masses are appreciated in the testicles. Left and right epididymis are normal. Rectal: Patient with  normal sphincter tone. Anus and perineum without scarring or rashes. No rectal masses are appreciated. Prostate is approximately *** grams, *** nodules are appreciated. Seminal vesicles are normal. Neurologic: Grossly intact, no focal deficits, moving all 4 extremities. Psychiatric: Normal mood and affect.   Laboratory Data:  Prostate Specific Ag, Serum  Latest Ref Rng 0.0 - 4.0 ng/mL  10/01/2018 5.2 (H)   05/13/2019 6.7 (H)   01/15/2020 5.8 (H)   07/15/2020 6.0 (H)   03/10/2021 6.7 (H)   09/20/2021 0.6     Legend: (H) High I have reviewed the labs.    Pertinent Imaging: N/A   Assessment & Plan:    1. OAB -At goal with oxybutynin XL 10 mg daily -  Continue the oxybutynin  2. ED -He has not titrated up with the tadalafil, so have made arrangements with his pharmacy not but the tadalafil in the bubble pack and put it in a separate bottle so that he can take 2 tablets of the 10 mg tadalafil  No follow-ups on file.  These notes generated with voice recognition software. I apologize for typographical errors.  Zara Council, PA-C  [facility] [address]  [place], [place] [address] [phone]

## 2021-11-08 ENCOUNTER — Encounter: Payer: Self-pay | Admitting: Urology

## 2021-11-08 ENCOUNTER — Ambulatory Visit (INDEPENDENT_AMBULATORY_CARE_PROVIDER_SITE_OTHER): Payer: Medicare Other | Admitting: Urology

## 2021-11-08 VITALS — BP 136/72 | HR 68 | Ht 64.0 in | Wt 152.0 lb

## 2021-11-08 DIAGNOSIS — N3281 Overactive bladder: Secondary | ICD-10-CM

## 2021-11-08 DIAGNOSIS — N529 Male erectile dysfunction, unspecified: Secondary | ICD-10-CM

## 2022-11-13 ENCOUNTER — Ambulatory Visit: Payer: Medicare Other | Admitting: Urology

## 2022-12-04 NOTE — Progress Notes (Signed)
12/05/2022 9:11 PM   Malik Bridges 11-29-1950 [id number]  Referring provider: Marya Fossa, PA-C [address] [place],  [place] [address]  Urological history: 1.  Elevated PSA --Biopsy Dr. Mena Goes April 2021 PSA 6.7; prostate volume 93 g -Benign pathology -HoLEP, 05/2021 - negative   2. BPH with LU TS -PSA (09/2021) 0.6 -cysto, 04/2021 - Coapting lateral lobes  prostate with hypervascularity/friability- Elevated bladder neck -prostate volume 100 grams -HoLEP, 05/2021 -pathology negative  3. ED -contributing factors of age, BPH, HTN, DM, sleep apnea, HLD, depression and history of smoking -tadalafil 20 mg, on-demand-dosing  4. OAB -Contributing factors of age, hypertension, diabetes, history of smoking, sleep apnea and diuretics -oxybutynin XL 10 mg daily   Chief Complaint  Patient presents with   Follow-up   HPI: Malik Bridges is a 71 y.o. male who presents today after a 1-year follow up.   Previous records reviewed.   Patient is not having spontaneous erections.  He denies any pain or curvature with erections.   He is having no success with taking the tadalafil 20 mg on-demand dosing.   SHIM     Row Name 12/05/22 1341         SHIM: Over the last 6 months:   How do you rate your confidence that you could get and keep an erection? Low     When you had erections with sexual stimulation, how often were your erections hard enough for penetration (entering your partner)? Almost Never or Never     During sexual intercourse, how often were you able to maintain your erection after you had penetrated (entered) your partner? Almost Never or Never     During sexual intercourse, how difficult was it to maintain your erection to completion of intercourse? Did not attempt intercourse     When you attempted sexual intercourse, how often was it satisfactory for you? Did not attempt intercourse       SHIM Total Score   SHIM 4             Score: 1-7 Severe  ED 8-11 Moderate ED 12-16 Mild-Moderate ED 17-21 Mild ED 22-25 No ED      He is having frequency and urge incontinence.  His PVR does indicate adequate emptying.  He he has not been taking the oxybutynin XL 10 mg daily.  Patient denies any modifying or aggravating factors.  Patient denies any recent UTI's, gross hematuria, dysuria or suprapubic/flank pain.  Patient denies any fevers, chills, nausea or vomiting.     IPSS     Row Name 12/05/22 1300         International Prostate Symptom Score   How often have you had the sensation of not emptying your bladder? Almost always     How often have you had to urinate less than every two hours? Almost always     How often have you found you stopped and started again several times when you urinated? Not at All     How often have you found it difficult to postpone urination? Almost always     How often have you had a weak urinary stream? Almost always     How often have you had to strain to start urination? Not at All     How many times did you typically get up at night to urinate? 3 Times     Total IPSS Score 23       Quality of Life due to  urinary symptoms   If you were to spend the rest of your life with your urinary condition just the way it is now how would you feel about that? Unhappy              Score:  1-7 Mild 8-19 Moderate 20-35 Severe   PMH: Past Medical History:  Diagnosis Date   Abdominal hernia    Asthma    Diabetes mellitus (HCC)    Pt taking Insulin   Elevated PSA 05/2019   Enlarged prostate    Hydrocele, bilateral 06/2019   Hyperlipidemia    Hypertension     Surgical History: Past Surgical History:  Procedure Laterality Date   CIRCUMCISION     COLONOSCOPY WITH PROPOFOL N/A 05/15/2018   Procedure: COLONOSCOPY WITH PROPOFOL;  Surgeon: Toney Reil, MD;  Location: [facility];  Service: Gastroenterology;  Laterality: N/A;   EYE SURGERY Bilateral    cataract extractions   HOLEP-LASER  ENUCLEATION OF THE PROSTATE WITH MORCELLATION N/A 05/12/2021   Procedure: HOLEP-LASER ENUCLEATION OF THE PROSTATE WITH MORCELLATION;  Surgeon: Sondra Come, MD;  Location: [facility];  Service: Urology;  Laterality: N/A;   HYDROCELE EXCISION Right 07/21/2019   Procedure: HYDROCELECTOMY ADULT;  Surgeon: Riki Altes, MD;  Location: [facility];  Service: Urology;  Laterality: Right;   ROBOT ASSISTED INGUINAL HERNIA REPAIR Bilateral 05/01/2018   Procedure: ROBOT ASSISTED INGUINAL HERNIA REPAIR;  Surgeon: Leafy Ro, MD;  Location: [facility];  Service: General;  Laterality: Bilateral;   UMBILICAL HERNIA REPAIR  05/01/2018   Procedure: HERNIA REPAIR UMBILICAL ADULT;  Surgeon: Leafy Ro, MD;  Location: [facility];  Service: General;;    Home Medications:  Allergies as of 12/05/2022       Reactions   Hydrochlorothiazide Other (See Comments)   K to 3.0 on Zestoretic 20-25        Medication List        Accurate as of December 05, 2022 11:59 PM. If you have any questions, ask your nurse or doctor.          Accu-Chek Guide Me w/Device Kit   Accu-Chek Softclix Lancets lancets SMARTSIG:Topical   albuterol 108 (90 Base) MCG/ACT inhaler Commonly known as: VENTOLIN HFA Inhale 2 puffs into the lungs every 4 (four) hours as needed for wheezing or shortness of breath.   atorvastatin 20 MG tablet Commonly known as: LIPITOR Take 20 mg by mouth daily.   empagliflozin 25 MG Tabs tablet Commonly known as: JARDIANCE Take 25 mg by mouth daily.   Flovent HFA 110 MCG/ACT inhaler Generic drug: fluticasone Inhale 2 puffs into the lungs 2 (two) times daily.   glucose blood test strip Check twice a day, E11.42   ibuprofen 200 MG tablet Commonly known as: ADVIL Take 200 mg by mouth every 6 (six) hours as needed for mild pain or moderate pain.   insulin glargine 100 UNIT/ML injection Commonly known as: LANTUS Inject 30-60 Units into the skin See admin instructions. Inject 40 units in the  morning and 60 units at night   lisinopril 20 MG tablet Commonly known as: ZESTRIL Take 20 mg by mouth daily.   metFORMIN 1000 MG tablet Commonly known as: GLUCOPHAGE Take 1,000 mg by mouth 2 (two) times daily with a meal.   oxybutynin 10 MG 24 hr tablet Commonly known as: DITROPAN-XL Take 1 tablet (10 mg total) by mouth daily.   spironolactone 25 MG tablet Commonly known as: ALDACTONE Take 12.5 mg by mouth daily.  tadalafil 10 MG tablet Commonly known as: Cialis Take 1 tablet (10 mg total) by mouth daily as needed for erectile dysfunction.   tamsulosin 0.4 MG Caps capsule Commonly known as: FLOMAX Take 0.4 mg by mouth daily.   traZODone 50 MG tablet Commonly known as: DESYREL Take 50 mg by mouth at bedtime.   TRUEplus Pen Needles 31G X 6 MM Misc Generic drug: Insulin Pen Needle        Allergies:  Allergies  Allergen Reactions   Hydrochlorothiazide Other (See Comments)    K to 3.0 on Zestoretic 20-25    Family History: No family history on file.  Social History:  reports that he has quit smoking. His smoking use included cigarettes. He has been exposed to tobacco smoke. His smokeless tobacco use includes snuff. He reports that he does not currently use alcohol. He reports that he does not use drugs.  ROS: Pertinent ROS in HPI  Physical Exam: BP (!) 159/71   Pulse 80   Constitutional:  Well nourished. Alert and oriented, No acute distress. HEENT: [place] AT, moist mucus membranes.  Trachea midline Cardiovascular: No clubbing, cyanosis, or edema. Respiratory: Normal respiratory effort, no increased work of breathing. Neurologic: Grossly intact, no focal deficits, moving all 4 extremities. Psychiatric: Normal mood and affect.   Laboratory Data: N/A  Pertinent Imaging: N/A   Assessment & Plan:    1. OAB -I spoke with his pharmacy and they inform me he has not been taking the oxybutynin since last year -refill oxybutynin XL 10 mg daily   2. ED -He  will try taking the tadalafil 10 mg daily to see if this improves his erectile dysfunction  3. BPH with LU TS -s/p HoLEP (2023)   Return in about 12 weeks (around 02/27/2023) for I PSS, SHIM, PVR .  These notes generated with voice recognition software. I apologize for typographical errors.  Cloretta Ned  [facility] [address]  [address] [place], [place] [address] [phone]

## 2022-12-05 ENCOUNTER — Encounter: Payer: Self-pay | Admitting: Urology

## 2022-12-05 ENCOUNTER — Ambulatory Visit: Payer: Medicare HMO | Admitting: Urology

## 2022-12-05 VITALS — BP 159/71 | HR 80

## 2022-12-05 DIAGNOSIS — N3281 Overactive bladder: Secondary | ICD-10-CM

## 2022-12-05 DIAGNOSIS — N400 Enlarged prostate without lower urinary tract symptoms: Secondary | ICD-10-CM

## 2022-12-05 DIAGNOSIS — N529 Male erectile dysfunction, unspecified: Secondary | ICD-10-CM | POA: Diagnosis not present

## 2022-12-05 MED ORDER — TADALAFIL 10 MG PO TABS: 10.0000 mg | ORAL_TABLET | Freq: Every day | ORAL | 6 refills | Status: AC | PRN

## 2022-12-05 MED ORDER — OXYBUTYNIN CHLORIDE ER 10 MG PO TB24: 10.0000 mg | ORAL_TABLET | Freq: Every day | ORAL | 11 refills | Status: AC

## 2023-02-17 NOTE — Progress Notes (Deleted)
02/20/2023 10:18 PM   Malik Bridges [birth year] [id number]  Referring provider: Marya Fossa, PA-C [address] [place],  [place] [address]  Urological history: 1.  Elevated PSA --Biopsy Dr. Mena Goes April 2021 PSA 6.7; prostate volume 93 g -Benign pathology -HoLEP, 05/2021 - negative   2. BPH with LU TS -PSA (09/2021) 0.6 -cysto, 04/2021 - Coapting lateral lobes  prostate with hypervascularity/friability- Elevated bladder neck -prostate volume 100 grams -HoLEP, 05/2021 -pathology negative  3. ED -contributing factors of age, BPH, HTN, DM, sleep apnea, HLD, depression and history of smoking -tadalafil 20 mg, on-demand-dosing  4. OAB -Contributing factors of age, hypertension, diabetes, history of smoking, sleep apnea and diuretics -oxybutynin XL 10 mg daily   No chief complaint on file.  HPI: Malik Bridges is a 72 y.o. male who presents today after a 1-year follow up.   Previous records reviewed.   At his visit on 12/05/2022, patient is not having spontaneous erections.  He denies any pain or curvature with erections.   He is having no success with taking the tadalafil 20 mg on-demand dosing.   He was instructed to take the tadalafil 10 mg daily.   He is having frequency and urge incontinence.  His PVR does indicate adequate emptying.  He he has not been taking the oxybutynin XL 10 mg daily.  Patient denies any modifying or aggravating factors.  Patient denies any recent UTI's, gross hematuria, dysuria or suprapubic/flank pain.  Patient denies any fevers, chills, nausea or vomiting.  He as instructed to take the oxybutynin XL 10 mg daily.    SHIM ***    Score: 1-7 Severe ED 8-11 Moderate ED 12-16 Mild-Moderate ED 17-21 Mild ED 22-25 No ED      I PSS ***  PVR ***      Score:  1-7 Mild 8-19 Moderate 20-35 Severe   PMH: Past Medical History:  Diagnosis Date   Abdominal hernia    Asthma    Diabetes mellitus (HCC)    Pt taking Insulin    Elevated PSA 05/2019   Enlarged prostate    Hydrocele, bilateral 06/2019   Hyperlipidemia    Hypertension     Surgical History: Past Surgical History:  Procedure Laterality Date   CIRCUMCISION     COLONOSCOPY WITH PROPOFOL N/A 05/15/2018   Procedure: COLONOSCOPY WITH PROPOFOL;  Surgeon: Toney Reil, MD;  Location: [facility];  Service: Gastroenterology;  Laterality: N/A;   EYE SURGERY Bilateral    cataract extractions   HOLEP-LASER ENUCLEATION OF THE PROSTATE WITH MORCELLATION N/A 05/12/2021   Procedure: HOLEP-LASER ENUCLEATION OF THE PROSTATE WITH MORCELLATION;  Surgeon: Sondra Come, MD;  Location: [facility];  Service: Urology;  Laterality: N/A;   HYDROCELE EXCISION Right 07/21/2019   Procedure: HYDROCELECTOMY ADULT;  Surgeon: Riki Altes, MD;  Location: [facility];  Service: Urology;  Laterality: Right;   ROBOT ASSISTED INGUINAL HERNIA REPAIR Bilateral 05/01/2018   Procedure: ROBOT ASSISTED INGUINAL HERNIA REPAIR;  Surgeon: Leafy Ro, MD;  Location: [facility];  Service: General;  Laterality: Bilateral;   UMBILICAL HERNIA REPAIR  05/01/2018   Procedure: HERNIA REPAIR UMBILICAL ADULT;  Surgeon: Leafy Ro, MD;  Location: [facility];  Service: General;;    Home Medications:  Allergies as of 02/20/2023       Reactions   Hydrochlorothiazide Other (See Comments)   K to 3.0 on Zestoretic 20-25        Medication List  Accurate as of February 17, 2023 10:18 PM. If you have any questions, ask your nurse or doctor.          Accu-Chek Guide Me w/Device Kit   Accu-Chek Softclix Lancets lancets SMARTSIG:Topical   albuterol 108 (90 Base) MCG/ACT inhaler Commonly known as: VENTOLIN HFA Inhale 2 puffs into the lungs every 4 (four) hours as needed for wheezing or shortness of breath.   atorvastatin 20 MG tablet Commonly known as: LIPITOR Take 20 mg by mouth daily.   empagliflozin 25 MG Tabs tablet Commonly known as: JARDIANCE Take 25 mg by mouth  daily.   Flovent HFA 110 MCG/ACT inhaler Generic drug: fluticasone Inhale 2 puffs into the lungs 2 (two) times daily.   glucose blood test strip Check twice a day, E11.42   ibuprofen 200 MG tablet Commonly known as: ADVIL Take 200 mg by mouth every 6 (six) hours as needed for mild pain or moderate pain.   insulin glargine 100 UNIT/ML injection Commonly known as: LANTUS Inject 30-60 Units into the skin See admin instructions. Inject 40 units in the morning and 60 units at night   lisinopril 20 MG tablet Commonly known as: ZESTRIL Take 20 mg by mouth daily.   metFORMIN 1000 MG tablet Commonly known as: GLUCOPHAGE Take 1,000 mg by mouth 2 (two) times daily with a meal.   oxybutynin 10 MG 24 hr tablet Commonly known as: DITROPAN-XL Take 1 tablet (10 mg total) by mouth daily.   spironolactone 25 MG tablet Commonly known as: ALDACTONE Take 12.5 mg by mouth daily.   tadalafil 10 MG tablet Commonly known as: Cialis Take 1 tablet (10 mg total) by mouth daily as needed for erectile dysfunction.   tamsulosin 0.4 MG Caps capsule Commonly known as: FLOMAX Take 0.4 mg by mouth daily.   traZODone 50 MG tablet Commonly known as: DESYREL Take 50 mg by mouth at bedtime.   TRUEplus Pen Needles 31G X 6 MM Misc Generic drug: Insulin Pen Needle        Allergies:  Allergies  Allergen Reactions   Hydrochlorothiazide Other (See Comments)    K to 3.0 on Zestoretic 20-25    Family History: No family history on file.  Social History:  reports that he has quit smoking. His smoking use included cigarettes. He has been exposed to tobacco smoke. His smokeless tobacco use includes snuff. He reports that he does not currently use alcohol. He reports that he does not use drugs.  ROS: Pertinent ROS in HPI  Physical Exam: There were no vitals taken for this visit.  Constitutional:  Well nourished. Alert and oriented, No acute distress. HEENT:  AT, moist mucus membranes.  Trachea  midline, no masses. Cardiovascular: No clubbing, cyanosis, or edema. Respiratory: Normal respiratory effort, no increased work of breathing. GI: Abdomen is soft, non tender, non distended, no abdominal masses. Liver and spleen not palpable.  No hernias appreciated.  Stool sample for occult testing is not indicated.   GU: No CVA tenderness.  No bladder fullness or masses.  Patient with circumcised/uncircumcised phallus. ***Foreskin easily retracted***  Urethral meatus is patent.  No penile discharge. No penile lesions or rashes. Scrotum without lesions, cysts, rashes and/or edema.  Testicles are located scrotally bilaterally. No masses are appreciated in the testicles. Left and right epididymis are normal. Rectal: Patient with  normal sphincter tone. Anus and perineum without scarring or rashes. No rectal masses are appreciated. Prostate is approximately *** grams, *** nodules are appreciated. Seminal vesicles are normal.  Skin: No rashes, bruises or suspicious lesions. Lymph: No cervical or inguinal adenopathy. Neurologic: Grossly intact, no focal deficits, moving all 4 extremities. Psychiatric: Normal mood and affect.   Laboratory Data: N/A  Pertinent Imaging: ***   Assessment & Plan:    1. OAB -I spoke with his pharmacy and they inform me he has not been taking the oxybutynin since last year -refill oxybutynin XL 10 mg daily   2. ED -He will try taking the tadalafil 10 mg daily to see if this improves his erectile dysfunction  3. BPH with LU TS -s/p HoLEP (2023)   No follow-ups on file.  These notes generated with voice recognition software. I apologize for typographical errors.  Cloretta Ned  Dr [facility] [address]  [address] [place], [place] [address] [phone]

## 2023-02-20 ENCOUNTER — Ambulatory Visit: Payer: Medicare HMO | Admitting: Urology

## 2023-02-20 DIAGNOSIS — N400 Enlarged prostate without lower urinary tract symptoms: Secondary | ICD-10-CM

## 2023-02-20 DIAGNOSIS — N529 Male erectile dysfunction, unspecified: Secondary | ICD-10-CM

## 2023-02-20 DIAGNOSIS — N3281 Overactive bladder: Secondary | ICD-10-CM

## 2023-05-16 DIAGNOSIS — H401122 Primary open-angle glaucoma, left eye, moderate stage: Secondary | ICD-10-CM | POA: Diagnosis not present

## 2023-05-24 DIAGNOSIS — H401122 Primary open-angle glaucoma, left eye, moderate stage: Secondary | ICD-10-CM | POA: Diagnosis not present

## 2023-05-24 DIAGNOSIS — Z9889 Other specified postprocedural states: Secondary | ICD-10-CM | POA: Diagnosis not present

## 2023-05-24 DIAGNOSIS — H401111 Primary open-angle glaucoma, right eye, mild stage: Secondary | ICD-10-CM | POA: Diagnosis not present

## 2023-06-17 DIAGNOSIS — R2681 Unsteadiness on feet: Secondary | ICD-10-CM | POA: Diagnosis not present

## 2023-06-17 DIAGNOSIS — E785 Hyperlipidemia, unspecified: Secondary | ICD-10-CM | POA: Diagnosis not present

## 2023-06-17 DIAGNOSIS — E663 Overweight: Secondary | ICD-10-CM | POA: Diagnosis not present

## 2023-06-17 DIAGNOSIS — Z6829 Body mass index (BMI) 29.0-29.9, adult: Secondary | ICD-10-CM | POA: Diagnosis not present

## 2023-06-17 DIAGNOSIS — F17220 Nicotine dependence, chewing tobacco, uncomplicated: Secondary | ICD-10-CM | POA: Diagnosis not present

## 2023-06-17 DIAGNOSIS — J449 Chronic obstructive pulmonary disease, unspecified: Secondary | ICD-10-CM | POA: Diagnosis not present

## 2023-06-17 DIAGNOSIS — M545 Low back pain, unspecified: Secondary | ICD-10-CM | POA: Diagnosis not present

## 2023-06-17 DIAGNOSIS — Z008 Encounter for other general examination: Secondary | ICD-10-CM | POA: Diagnosis not present

## 2023-06-17 DIAGNOSIS — I10 Essential (primary) hypertension: Secondary | ICD-10-CM | POA: Diagnosis not present

## 2023-06-17 DIAGNOSIS — E1169 Type 2 diabetes mellitus with other specified complication: Secondary | ICD-10-CM | POA: Diagnosis not present

## 2023-08-02 DIAGNOSIS — Z1389 Encounter for screening for other disorder: Secondary | ICD-10-CM | POA: Diagnosis not present

## 2023-08-02 DIAGNOSIS — Z1331 Encounter for screening for depression: Secondary | ICD-10-CM | POA: Diagnosis not present

## 2023-08-02 DIAGNOSIS — E119 Type 2 diabetes mellitus without complications: Secondary | ICD-10-CM | POA: Diagnosis not present

## 2023-08-02 DIAGNOSIS — Z0131 Encounter for examination of blood pressure with abnormal findings: Secondary | ICD-10-CM | POA: Diagnosis not present

## 2023-08-02 DIAGNOSIS — Z0130 Encounter for examination of blood pressure without abnormal findings: Secondary | ICD-10-CM | POA: Diagnosis not present

## 2023-08-02 DIAGNOSIS — Z712 Person consulting for explanation of examination or test findings: Secondary | ICD-10-CM | POA: Diagnosis not present

## 2023-08-02 DIAGNOSIS — Z23 Encounter for immunization: Secondary | ICD-10-CM | POA: Diagnosis not present

## 2023-09-25 DIAGNOSIS — K5900 Constipation, unspecified: Secondary | ICD-10-CM | POA: Diagnosis not present

## 2023-09-25 DIAGNOSIS — Z008 Encounter for other general examination: Secondary | ICD-10-CM | POA: Diagnosis not present
